# Patient Record
Sex: Female | Born: 1985 | Race: White | Hispanic: No | Marital: Married | Smoking: Current every day smoker
Health system: Southern US, Community
[De-identification: ages and names within clinical notes are randomized; demographics above are authoritative.]

## PROBLEM LIST (undated history)

## (undated) ENCOUNTER — Inpatient Hospital Stay: Admission: EM | Payer: Self-pay | Source: Home / Self Care

## (undated) DIAGNOSIS — R102 Pelvic and perineal pain: Secondary | ICD-10-CM

## (undated) DIAGNOSIS — F419 Anxiety disorder, unspecified: Secondary | ICD-10-CM

## (undated) DIAGNOSIS — L853 Xerosis cutis: Secondary | ICD-10-CM

## (undated) DIAGNOSIS — Z87442 Personal history of urinary calculi: Secondary | ICD-10-CM

## (undated) DIAGNOSIS — N920 Excessive and frequent menstruation with regular cycle: Secondary | ICD-10-CM

## (undated) DIAGNOSIS — J410 Simple chronic bronchitis: Secondary | ICD-10-CM

## (undated) DIAGNOSIS — N809 Endometriosis, unspecified: Secondary | ICD-10-CM

## (undated) HISTORY — PX: WISDOM TOOTH EXTRACTION: SHX21

## (undated) HISTORY — PX: EYE SURGERY: SHX253

## (undated) HISTORY — PX: EXTRACORPOREAL SHOCK WAVE LITHOTRIPSY: SHX1557

---
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
## 2006-08-21 ENCOUNTER — Inpatient Hospital Stay (HOSPITAL_COMMUNITY): Admission: EM | Admit: 2006-08-21 | Discharge: 2006-08-23 | Payer: Self-pay | Admitting: Psychiatry

## 2006-08-21 ENCOUNTER — Ambulatory Visit: Payer: Self-pay | Admitting: Psychiatry

## 2007-06-10 ENCOUNTER — Emergency Department (HOSPITAL_COMMUNITY): Admission: EM | Admit: 2007-06-10 | Discharge: 2007-06-10 | Payer: Self-pay | Admitting: Emergency Medicine

## 2009-09-28 ENCOUNTER — Emergency Department (HOSPITAL_COMMUNITY): Admission: EM | Admit: 2009-09-28 | Discharge: 2009-09-28 | Payer: Self-pay | Admitting: Emergency Medicine

## 2010-03-05 ENCOUNTER — Emergency Department: Payer: Self-pay | Admitting: Emergency Medicine

## 2010-04-28 ENCOUNTER — Emergency Department: Payer: Self-pay | Admitting: Unknown Physician Specialty

## 2010-12-23 NOTE — Discharge Summary (Signed)
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  She overdosed on Prozac, and it was her  first overdose ever.  She described it only as an effort to get her  husband's attention.  She reported feeling neglected by him.  She  reported being sad and increasingly depressed since the birth of her  first child 2 years ago.  She was not involved in any psychiatric  treatment on outpatient basis.  Please refer to the admission note for  further details pertaining to the symptoms, circumstances and history  that led to her hospitalization.  She was also felt to have a problem of  taking excessive prescribed Xanax.  She was given an initial Axis I  diagnosis of depressive disorder NOS, benzodiazepine abuse, and marital  problem.   MEDICAL AND LABORATORY:  The patient was medically and physically  assessed by the psychiatric nurse practitioner.  She was in good health  without any active or chronic medical problems.  There were no  significant medical issues.   HOSPITAL COURSE:  The patient was admitted to the adult inpatient  psychiatric service.  She presented as a well-nourished, well-developed  young woman who was very pleasant, alert, and fully oriented.  She was  sad and appeared somewhat depressed.  She was quite open, and admitting  her sadness around her situation at home, but was fairly convincing in  stating that she had not been trying to  kill herself.  She did admit  that she was trying to get high.  She presented without any symptoms  of psychosis or thought disorder.  She verbalized a strong desire for  help.   The patient had been taking Prozac, prescribed by the family doctor, for  quite some time, but she did not feel it was helping her.  The patient,  during this hospital stay, concluded that she did not like the idea of  taking any medication, and wanted to get away from all psychotropic  medication.  She described a sense of being psychologically dependent on  medication and wanted to do without that.  As such, although there was  discussion about the possibility of a new antidepressant trial, the  patient declined this.   The patient was ordered Librium on a p.r.n. basis, but it did not really  appear to be necessary.  She did not have any significant Xanax  withdrawal symptoms during this inpatient stay.   Early on in her stay, there was a family session involving the patient  and  her husband.  In that meeting she reiterated that she was not  suicidal.  She and her husband discussed the stressors that they had  been dealing with, and the fact that the patient tends not to express  her feelings with him openly.  Husband stated that he wanted to support  the patient and stated that he did not know that the patient was  addicted to pills such as Xanax.  The husband stated he realized that he  needed to be more understanding of the patient's feelings.  They  discussed the possibility of the patient putting some distance between  herself and her family for awhile until she is feeling better.  Apparently this was some source of stress for the patient.  The husband  stated that he was going to listen more to the patient.  The patient  stated that she wanted her husband to support her and not blame her for  feeling badly.  The patient stated that she would never hurt herself.  She talked of wanting to go to  counseling after discharge.  The husband  stated that he would remove all pills from their home.  There were given  a pamphlet on suicide prevention and the crisis hotline.   By the third hospital day, the patient was in much better spirits, and  reported feeling good about the family session she had with her husband.  She agreed to the following aftercare plan.   AFTERCARE:  The patient was to [facility] on [facility].   DISCHARGE MEDICATIONS:  None, as the patient refused new medication  trials.   DISCHARGE DIAGNOSES:  AXIS I: Depressive disorder NOS.  History of  benzodiazepine abuse, early remission.  AXIS II: Deferred.  AXIS III: No acute or chronic illnesses.  AXIS IV: Stressors severe.  AXIS V: GAF on discharge 70.      Anselm Jungling, MD  Electronically Signed     SPB/MEDQ  D:  08/24/2006  T:  08/24/2006  Job:  [id number]

## 2010-12-24 ENCOUNTER — Emergency Department: Payer: Self-pay | Admitting: Unknown Physician Specialty

## 2011-05-16 LAB — I-STAT 8, (EC8 V) (CONVERTED LAB)
Acid-base deficit: 2
Chloride: 108
HCT: 41
Hemoglobin: 13.9
Operator id: 198171
Potassium: 4.1
Sodium: 139
pCO2, Ven: 34.3 — ABNORMAL LOW

## 2011-05-16 LAB — POCT PREGNANCY, URINE: Operator id: 198171

## 2011-05-16 LAB — D-DIMER, QUANTITATIVE: D-Dimer, Quant: <0.22

## 2011-05-16 LAB — RAPID URINE DRUG SCREEN, HOSP PERFORMED
Barbiturates: NOT DETECTED
Opiates: NOT DETECTED

## 2011-05-16 LAB — POCT I-STAT CREATININE: Operator id: 198171

## 2011-09-24 ENCOUNTER — Emergency Department: Payer: Self-pay | Admitting: Emergency Medicine

## 2012-01-17 ENCOUNTER — Emergency Department: Payer: Self-pay | Admitting: Emergency Medicine

## 2012-01-17 LAB — PREGNANCY, URINE: Pregnancy Test, Urine: NEGATIVE m[IU]/mL

## 2012-01-17 LAB — URINALYSIS, COMPLETE
Blood: NEGATIVE
Glucose,UR: NEGATIVE mg/dL (ref 0–75)
Nitrite: NEGATIVE
Ph: 7 (ref 4.5–8.0)
Protein: NEGATIVE
RBC,UR: 1 /HPF (ref 0–5)
Squamous Epithelial: 4
WBC UR: 3 /HPF (ref 0–5)

## 2012-01-17 LAB — COMPREHENSIVE METABOLIC PANEL
Alkaline Phosphatase: 76 U/L (ref 50–136)
Bilirubin,Total: 0.2 mg/dL (ref 0.2–1.0)
Chloride: 109 mmol/L — ABNORMAL HIGH (ref 98–107)
Co2: 28 mmol/L (ref 21–32)
Creatinine: 0.68 mg/dL (ref 0.60–1.30)
EGFR (African American): >60
EGFR (Non-African Amer.): >60
Osmolality: 280 (ref 275–301)
Potassium: 4.6 mmol/L (ref 3.5–5.1)
SGOT(AST): 15 U/L (ref 15–37)
Sodium: 140 mmol/L (ref 136–145)
Total Protein: 7.8 g/dL (ref 6.4–8.2)

## 2012-01-17 LAB — CBC
HCT: 44.8 % (ref 35.0–47.0)
HGB: 15.1 g/dL (ref 12.0–16.0)
MCH: 31.5 pg (ref 26.0–34.0)
WBC: 7 10*3/uL (ref 3.6–11.0)

## 2012-04-19 ENCOUNTER — Ambulatory Visit: Payer: Self-pay | Admitting: Nurse Practitioner

## 2012-05-03 ENCOUNTER — Encounter (HOSPITAL_COMMUNITY): Payer: Self-pay

## 2012-05-14 ENCOUNTER — Encounter (HOSPITAL_COMMUNITY): Payer: Self-pay

## 2012-05-14 ENCOUNTER — Encounter (HOSPITAL_COMMUNITY)
Admission: RE | Admit: 2012-05-14 | Discharge: 2012-05-14 | Disposition: A | Discharge status: Home / Self Care | Payer: Commercial Managed Care - PPO | Source: Ambulatory Visit | Attending: Obstetrics and Gynecology | Admitting: Obstetrics and Gynecology

## 2012-05-14 LAB — CBC
HCT: 41.5 % (ref 36.0–46.0)
MCH: 31 pg (ref 26.0–34.0)
MCV: 90.6 fL (ref 78.0–100.0)
RDW: 13.3 % (ref 11.5–15.5)
WBC: 7.2 10*3/uL (ref 4.0–10.5)

## 2012-05-14 NOTE — Patient Instructions (Addendum)
   Your procedure is scheduled on:  Wednesday, Oct 9th  Enter through the [employer] [facility] at: 7 am Pick up the phone at the desk and dial [phone] and inform us of your arrival.  Please call this number if you have any problems the morning of surgery: [phone]  Remember: Do not eat food after midnight: Tuesday Do not drink clear liquids after midnight: Tuesday: Take these medicines the morning of surgery with a SIP OF WATER: None  Do not wear jewelry, make-up, or FINGER nail polish No metal in your hair or on your body. Do not wear lotions, powders, perfumes. You may wear deodorant.  Please use your CHG wash as directed prior to surgery.  Do not shave anywhere for at least 12 hours prior to first CHG shower.  Do not bring valuables to the hospital. Contacts, dentures or bridgework may not be worn into surgery.  Patients discharged on the day of surgery will not be allowed to drive home.  Home with fiance Leidy Bodin cell [phone]

## 2012-05-14 NOTE — H&P (Signed)
[name] is an 26 y.o. female. Presenting for diagnostic laparoscopy with laser standby and hysteroscopy.   Patient with a history of extremely heavy cycles with severe dysmenorrhea.  Also pain with intercourse during deep penetration.  Ultrasound of pelvis was normal.  Maybe dealing with endometriosis.  Alternatives discussed including birth control pills, mirena IUD, depo provera vs surgery.  Pertinent Gynecological History: Menses: flow is excessive with use of 7 pads or tampons on heaviest days Bleeding: regular but heavy Contraception: vasectomy DES exposure: denies Blood transfusions: none Sexually transmitted diseases: no past history Previous GYN Procedures: none  Last mammogram: na Date: na Last pap: abnormal: low grade dysplasia Date: 04/15/12 OB History: G2, P1   Menstrual History: Menarche age: 44 lmp 04/07/12      Pmh: usual childhood illnesses without complications  Surgery:  Cholecystectomy 2006.  Hernia repair 2006.  Left eye surgery at age 89  One vaginal delivery  familly history noncontributory  Social History: one pack per day tobacco use.  No alcohol use  Allergies:  Allergies  Allergen Reactions  . Ivp Dye (Iodinated Diagnostic Agents) Hives and Itching    No prescriptions prior to admission    Review of Systems  Constitutional: Negative.   Eyes: Negative.   Respiratory: Negative.   Cardiovascular: Negative.   Gastrointestinal: Positive for abdominal pain.  Genitourinary:       Heavy cycles with severe dysmenorrhea and dysparunia  Musculoskeletal: Negative.   Skin: Negative.   Neurological: Positive for dizziness and headaches.  Endo/Heme/Allergies: Negative.   Psychiatric/Behavioral: Negative.     Height 5\' 3"  (1.6 m), weight 65.772 kg (145 lb). Physical Exam  Constitutional: She is oriented to person, place, and time. She appears well-developed and well-nourished.  HENT:  Head: Normocephalic and atraumatic.  Mouth/Throat: Oropharynx  is clear and moist.  Eyes: Conjunctivae normal and EOM are normal. Pupils are equal, round, and reactive to light.  Neck: Normal range of motion. Neck supple.  Cardiovascular: Normal rate, regular rhythm and normal heart sounds.   Respiratory: Effort normal and breath sounds normal.  GI: Soft. She exhibits no distension and no mass. There is tenderness. There is no rebound and no guarding.  Genitourinary: Vagina normal and uterus normal.  Musculoskeletal: Normal range of motion. She exhibits no edema.  Neurological: She is alert and oriented to person, place, and time. She has normal reflexes.     No results found for this or any previous visit (from the past 24 hour(s)).  No results found.  Assessment/Plan: Menorrhagia with dyspareunia and dysmenorrhea.  R/o endometriosis or uterine abnormalities  precede with diagnostic laparoscopy with laser standby and hysteroscopy.  Risk discussed.  Infection.   Hemorrhage that could require transfusions with risk of aids or hepatitis.   Injury to adjacent organs such as bowel, bladder of blood vessels that could require exploratory surgery.  DVT and PE's.  Patient express an understanding of the indications and risks and alternatives  [name] S 05/14/2012, 10:14 AM

## 2012-05-15 ENCOUNTER — Ambulatory Visit (HOSPITAL_COMMUNITY): Payer: Commercial Managed Care - PPO | Admitting: Anesthesiology

## 2012-05-15 ENCOUNTER — Ambulatory Visit (HOSPITAL_COMMUNITY)
Admission: RE | Admit: 2012-05-15 | Discharge: 2012-05-15 | Disposition: A | Discharge status: Home / Self Care | Payer: Commercial Managed Care - PPO | Source: Ambulatory Visit | Attending: Obstetrics and Gynecology | Admitting: Obstetrics and Gynecology

## 2012-05-15 ENCOUNTER — Encounter (HOSPITAL_COMMUNITY)
Admission: RE | Disposition: A | Discharge status: Home / Self Care | Payer: Self-pay | Source: Ambulatory Visit | Attending: Obstetrics and Gynecology

## 2012-05-15 ENCOUNTER — Encounter (HOSPITAL_COMMUNITY): Payer: Self-pay | Admitting: Anesthesiology

## 2012-05-15 DIAGNOSIS — N946 Dysmenorrhea, unspecified: Secondary | ICD-10-CM | POA: Insufficient documentation

## 2012-05-15 DIAGNOSIS — IMO0002 Reserved for concepts with insufficient information to code with codable children: Secondary | ICD-10-CM | POA: Insufficient documentation

## 2012-05-15 DIAGNOSIS — N920 Excessive and frequent menstruation with regular cycle: Secondary | ICD-10-CM | POA: Insufficient documentation

## 2012-05-15 DIAGNOSIS — N809 Endometriosis, unspecified: Secondary | ICD-10-CM

## 2012-05-15 DIAGNOSIS — Z01818 Encounter for other preprocedural examination: Secondary | ICD-10-CM | POA: Insufficient documentation

## 2012-05-15 DIAGNOSIS — Z01812 Encounter for preprocedural laboratory examination: Secondary | ICD-10-CM | POA: Insufficient documentation

## 2012-05-15 DIAGNOSIS — N803 Endometriosis of pelvic peritoneum, unspecified: Secondary | ICD-10-CM | POA: Insufficient documentation

## 2012-05-15 HISTORY — PX: LAPAROSCOPY: SHX197

## 2012-05-15 SURGERY — LAPAROSCOPY OPERATIVE
Anesthesia: General | Site: Uterus | Wound class: Clean Contaminated

## 2012-05-15 MED ORDER — SCOPOLAMINE 1 MG/3DAYS TD PT72
1.0000 | MEDICATED_PATCH | TRANSDERMAL | Status: DC
  Administered 2012-05-15: 1.5 mg via TRANSDERMAL

## 2012-05-15 MED ORDER — NEOSTIGMINE METHYLSULFATE 1 MG/ML IJ SOLN
INTRAMUSCULAR | Status: DC | PRN
  Administered 2012-05-15: 3 mg via INTRAVENOUS

## 2012-05-15 MED ORDER — KETOROLAC TROMETHAMINE 30 MG/ML IJ SOLN
INTRAMUSCULAR | Status: AC
  Filled 2012-05-15: qty 1

## 2012-05-15 MED ORDER — DEXAMETHASONE SODIUM PHOSPHATE 4 MG/ML IJ SOLN
INTRAMUSCULAR | Status: DC | PRN
  Administered 2012-05-15: 5 mg via INTRAVENOUS

## 2012-05-15 MED ORDER — GLYCOPYRROLATE 0.2 MG/ML IJ SOLN
INTRAMUSCULAR | Status: AC
  Filled 2012-05-15: qty 1

## 2012-05-15 MED ORDER — LIDOCAINE-EPINEPHRINE (PF) 1 %-1:200000 IJ SOLN
INTRAMUSCULAR | Status: AC
  Filled 2012-05-15: qty 10

## 2012-05-15 MED ORDER — FENTANYL CITRATE 0.05 MG/ML IJ SOLN
INTRAMUSCULAR | Status: DC | PRN
  Administered 2012-05-15: 100 ug via INTRAVENOUS
  Administered 2012-05-15 (×3): 50 ug via INTRAVENOUS

## 2012-05-15 MED ORDER — MIDAZOLAM HCL 2 MG/2ML IJ SOLN
INTRAMUSCULAR | Status: AC
  Filled 2012-05-15: qty 2

## 2012-05-15 MED ORDER — LACTATED RINGERS IV SOLN
INTRAVENOUS | Status: DC
  Administered 2012-05-15 (×2): via INTRAVENOUS

## 2012-05-15 MED ORDER — GLYCOPYRROLATE 0.2 MG/ML IJ SOLN
INTRAMUSCULAR | Status: DC | PRN
  Administered 2012-05-15: 0.4 mg via INTRAVENOUS

## 2012-05-15 MED ORDER — FENTANYL CITRATE 0.05 MG/ML IJ SOLN
INTRAMUSCULAR | Status: AC
  Filled 2012-05-15: qty 5

## 2012-05-15 MED ORDER — ONDANSETRON HCL 4 MG/2ML IJ SOLN
INTRAMUSCULAR | Status: AC
  Filled 2012-05-15: qty 2

## 2012-05-15 MED ORDER — FENTANYL CITRATE 0.05 MG/ML IJ SOLN
INTRAMUSCULAR | Status: AC
  Filled 2012-05-15: qty 2

## 2012-05-15 MED ORDER — FENTANYL CITRATE 0.05 MG/ML IJ SOLN
25.0000 ug | INTRAMUSCULAR | Status: DC | PRN
  Administered 2012-05-15 (×3): 50 ug via INTRAVENOUS

## 2012-05-15 MED ORDER — BUPIVACAINE HCL (PF) 0.25 % IJ SOLN
INTRAMUSCULAR | Status: AC
  Filled 2012-05-15: qty 30

## 2012-05-15 MED ORDER — LIDOCAINE HCL (CARDIAC) 20 MG/ML IV SOLN
INTRAVENOUS | Status: DC | PRN
  Administered 2012-05-15: 100 mg via INTRAVENOUS

## 2012-05-15 MED ORDER — LACTATED RINGERS IR SOLN
Status: DC | PRN
  Administered 2012-05-15: 3000 mL

## 2012-05-15 MED ORDER — CEFAZOLIN SODIUM-DEXTROSE 2-3 GM-% IV SOLR
2.0000 g | INTRAVENOUS | Status: AC
  Administered 2012-05-15: 2 g via INTRAVENOUS

## 2012-05-15 MED ORDER — PROPOFOL 10 MG/ML IV EMUL
INTRAVENOUS | Status: DC | PRN
  Administered 2012-05-15: 200 mg via INTRAVENOUS

## 2012-05-15 MED ORDER — CEFAZOLIN SODIUM-DEXTROSE 2-3 GM-% IV SOLR
INTRAVENOUS | Status: AC
  Filled 2012-05-15: qty 50

## 2012-05-15 MED ORDER — MIDAZOLAM HCL 5 MG/5ML IJ SOLN
INTRAMUSCULAR | Status: DC | PRN
  Administered 2012-05-15: 2 mg via INTRAVENOUS

## 2012-05-15 MED ORDER — BUPIVACAINE HCL (PF) 0.25 % IJ SOLN
INTRAMUSCULAR | Status: DC | PRN
  Administered 2012-05-15: 6 mL

## 2012-05-15 MED ORDER — LIDOCAINE-EPINEPHRINE (PF) 1 %-1:200000 IJ SOLN
INTRAMUSCULAR | Status: DC | PRN
  Administered 2012-05-15: 10 mL

## 2012-05-15 MED ORDER — SCOPOLAMINE 1 MG/3DAYS TD PT72
MEDICATED_PATCH | TRANSDERMAL | Status: AC
  Administered 2012-05-15: 1.5 mg via TRANSDERMAL
  Filled 2012-05-15: qty 1

## 2012-05-15 MED ORDER — GLYCINE 1.5 % IR SOLN
Status: DC | PRN
  Administered 2012-05-15: 3000 mL

## 2012-05-15 MED ORDER — ROCURONIUM BROMIDE 50 MG/5ML IV SOLN
INTRAVENOUS | Status: AC
  Filled 2012-05-15: qty 1

## 2012-05-15 MED ORDER — KETOROLAC TROMETHAMINE 30 MG/ML IJ SOLN: 15.0000 mg | Freq: Once | INTRAMUSCULAR | Status: DC | PRN

## 2012-05-15 MED ORDER — NEOSTIGMINE METHYLSULFATE 1 MG/ML IJ SOLN
INTRAMUSCULAR | Status: AC
  Filled 2012-05-15: qty 10

## 2012-05-15 MED ORDER — LIDOCAINE HCL (CARDIAC) 20 MG/ML IV SOLN
INTRAVENOUS | Status: AC
  Filled 2012-05-15: qty 5

## 2012-05-15 MED ORDER — ROCURONIUM BROMIDE 100 MG/10ML IV SOLN
INTRAVENOUS | Status: DC | PRN
  Administered 2012-05-15: 35 mg via INTRAVENOUS

## 2012-05-15 MED ORDER — KETOROLAC TROMETHAMINE 30 MG/ML IJ SOLN
INTRAMUSCULAR | Status: DC | PRN
  Administered 2012-05-15: 30 mg via INTRAVENOUS

## 2012-05-15 MED ORDER — PROPOFOL 10 MG/ML IV EMUL
INTRAVENOUS | Status: AC
  Filled 2012-05-15: qty 20

## 2012-05-15 MED ORDER — ONDANSETRON HCL 4 MG/2ML IJ SOLN
INTRAMUSCULAR | Status: DC | PRN
  Administered 2012-05-15: 4 mg via INTRAVENOUS

## 2012-05-15 SURGICAL SUPPLY — 42 items
CABLE HIGH FREQUENCY MONO STRZ (ELECTRODE) IMPLANT
CANISTER SUCTION 2500CC (MISCELLANEOUS) ×6 IMPLANT
CATH ROBINSON RED A/P 16FR (CATHETERS) ×3 IMPLANT
CLOTH BEACON ORANGE TIMEOUT ST (SAFETY) ×3 IMPLANT
CONTAINER PREFILL 10% NBF 60ML (FORM) ×3 IMPLANT
DECANTER SPIKE VIAL GLASS SM (MISCELLANEOUS) ×3 IMPLANT
DERMABOND ADVANCED (GAUZE/BANDAGES/DRESSINGS) ×1
DERMABOND ADVANCED .7 DNX12 (GAUZE/BANDAGES/DRESSINGS) ×2 IMPLANT
DRESSING TELFA 8X3 (GAUZE/BANDAGES/DRESSINGS) ×3 IMPLANT
DRSG COVADERM PLUS 2X2 (GAUZE/BANDAGES/DRESSINGS) ×3 IMPLANT
ELECT REM PT RETURN 9FT ADLT (ELECTROSURGICAL) ×3
ELECTRODE REM PT RTRN 9FT ADLT (ELECTROSURGICAL) ×2 IMPLANT
GLOVE BIO SURGEON STRL SZ7 (GLOVE) ×9 IMPLANT
GLOVE BIOGEL PI IND STRL 7.0 (GLOVE) ×8 IMPLANT
GLOVE BIOGEL PI INDICATOR 7.0 (GLOVE) ×4
GOWN PREVENTION PLUS LG XLONG (DISPOSABLE) ×6 IMPLANT
GOWN PREVENTION PLUS XLARGE (GOWN DISPOSABLE) ×6 IMPLANT
GOWN STRL REIN 2XL XLG LVL4 (GOWN DISPOSABLE) ×3 IMPLANT
GOWN STRL REIN XL XLG (GOWN DISPOSABLE) ×6 IMPLANT
LOOP ANGLED CUTTING 22FR (CUTTING LOOP) IMPLANT
NEEDLE INSUFFLATION 14GA 120MM (NEEDLE) IMPLANT
NEEDLE SPNL 20GX3.5 QUINCKE YW (NEEDLE) ×3 IMPLANT
NS IRRIG 1000ML POUR BTL (IV SOLUTION) ×3 IMPLANT
PACK HYSTEROSCOPY LF (CUSTOM PROCEDURE TRAY) ×3 IMPLANT
PACK LAPAROSCOPY BASIN (CUSTOM PROCEDURE TRAY) ×3 IMPLANT
PAD OB MATERNITY 4.3X12.25 (PERSONAL CARE ITEMS) ×3 IMPLANT
POUCH SPECIMEN RETRIEVAL 10MM (ENDOMECHANICALS) IMPLANT
PROTECTOR NERVE ULNAR (MISCELLANEOUS) ×3 IMPLANT
SCISSORS LAP 5X35 DISP (ENDOMECHANICALS) IMPLANT
SCISSORS LAP 5X45 EPIX DISP (ENDOMECHANICALS) IMPLANT
SEALER TISSUE G2 CVD JAW 45CM (ENDOMECHANICALS) IMPLANT
SET IRRIG TUBING LAPAROSCOPIC (IRRIGATION / IRRIGATOR) ×3 IMPLANT
SUT VIC AB 3-0 PS2 18 (SUTURE) ×1
SUT VIC AB 3-0 PS2 18XBRD (SUTURE) ×2 IMPLANT
SUT VICRYL 0 ENDOLOOP (SUTURE) IMPLANT
SUT VICRYL 0 UR6 27IN ABS (SUTURE) ×3 IMPLANT
TOWEL OR 17X24 6PK STRL BLUE (TOWEL DISPOSABLE) ×6 IMPLANT
TROCAR BALLN 12MMX100 BLUNT (TROCAR) ×3 IMPLANT
TROCAR Z-THREAD BLADED 11X100M (TROCAR) IMPLANT
TROCAR Z-THREAD BLADED 5X100MM (TROCAR) ×3 IMPLANT
WARMER LAPAROSCOPE (MISCELLANEOUS) ×3 IMPLANT
WATER STERILE IRR 1000ML POUR (IV SOLUTION) ×3 IMPLANT

## 2012-05-15 NOTE — Op Note (Signed)
[name], [name] NO.:  [id number]  MEDICAL RECORD NO.:  [id number]  LOCATION:  [facility]                          FACILITY:  [facility]  PHYSICIAN:  Juluis Mire, M.D.   DATE OF BIRTH:  Oct 28, 1985  DATE OF PROCEDURE:  05/15/2012 DATE OF DISCHARGE:                              OPERATIVE REPORT   PREOPERATIVE DIAGNOSIS:  Menorrhagia with dysmenorrhea and dyspareunia.  POSTOPERATIVE DIAGNOSIS:  Pelvic endometriosis.  OPERATIVE PROCEDURE:  Paracervical block.  Cervical dilatation with hysteroscopy and endometrial curettings.  Open laparoscopy with cautery of endometriotic implant.  SURGEON:  Juluis Mire, MD  ANESTHESIA:  Paracervical block and general.  ESTIMATED BLOOD LOSS:  Minimal.  PACKS AND DRAINS:  None.  INTRAOPERATIVE BLOOD PLACED:  None.  COMPLICATIONS:  None.  INDICATIONS:  Are as dictated in the history and physical.  PROCEDURE:  The patient was taken to the OR and placed in supine position.  After satisfactory level of general endotracheal anesthesia was obtained, the patient was placed in the dorsal lithotomy position. The abdomen, perineum, and vagina were prepped out with Betadine and draped as a sterile field.  A speculum was placed in vaginal vault.  The cervix was grasped with single-tooth tenaculum.  A paracervical block with 1% Xylocaine with epinephrine was instituted.  We used approximately 10 mL.  Uterus sounded to approximately 9 cm.  Cervix was serially dilated to a size 23 Pratt dilator.  Hysteroscope was introduced.  Intrauterine cavity was distended using glycine.  She had a completely normal endometrium, visualized and noted to be normal.  The hysteroscope was then removed.  Total deficit of glycine was 35 mL. Endometrial curettings were obtained and sent for Pathology.  The Hulka tenaculum was then put in place.  Bladder was emptied by in and out catheterization.  Subumbilical incision was made with a knife.  The incision  was extended through the subcutaneous tissue.  Fascia was identified and entered sharply.  Incision was fashioned laterally.  We were able to enter the peritoneum with blunt finger pressure.  The open laparoscopic trocar was put in place and secured.  The abdomen was desufflated with carbon dioxide.  Laparoscope was introduced.  There was no evidence of injury to adjacent organs.  A 5-mm trocar was put in place in the suprapubic area under direct visualization.  Visualization of the appendix was normal.  The gallbladder was surgically absent. Both lateral gutters were clear.  Uterus was of normal size and shape. Both tubes were normal.  The left ovary had a hemorrhagic corpus luteum. Right ovary is unremarkable.  She did have areas of endometriosis along the right and left pelvic sidewall and cul-de-sac area.  No adhesions were noted.  Using the bipolar, the areas of endometriosis were cauterized.  On the sidewalls, we were careful to avoid the course of the ureter.  At the completion, all areas of endometriosis were completely cauterized.  She would be minimal endometriosis by criteria. We thoroughly irrigated the pelvis, removed irrigation.  The abdomen was deflated with carbon dioxide.  All trocars removed.  Subumbilical fascia closed with figure-of-eight of 0 Vicryl.  Skin was closed with interrupted subcuticulars of 4-0  Vicryl.  Suprapubic incision was closed with Dermabond.  The Hulka tenaculum was then removed.  The patient was taken out of the dorsal lithotomy position.  Once alert and extubated, transferred to recovery room in good condition.     Juluis Mire, M.D.     JSM/MEDQ  D:  05/15/2012  T:  05/15/2012  Job:  [id number]

## 2012-05-15 NOTE — Brief Op Note (Signed)
05/15/2012  9:19 AM  PATIENT:  [name]  26 y.o. female  PRE-OPERATIVE DIAGNOSIS:  menorrhagia, pelvic pain  POST-OPERATIVE DIAGNOSIS:  menorrhagia, pelvic pain  PROCEDURE:  Procedure(s) (LRB) with comments: LAPAROSCOPY OPERATIVE (N/A) DILATATION & CURETTAGE/HYSTEROSCOPY WITH RESECTOCOPE (N/A) - no resectoscope ABLATION ON ENDOMETRIOSIS (N/A)  SURGEON:  Surgeon(s) and Role:    * Juluis Mire, MD - Primary  PHYSICIAN ASSISTANT:   ASSISTANTS: none   ANESTHESIA:   local, general and paracervical block  EBL:  Total I/O In: 1200 [I.V.:1200] Out: 110 [Urine:100; Blood:10]  BLOOD ADMINISTERED:none  DRAINS: none   LOCAL MEDICATIONS USED:  XYLOCAINE with epi , Amount: 10 ml and OTHER nesicaine 6 cc  SPECIMEN:  Source of Specimen:  endometrial currettings  DISPOSITION OF SPECIMEN:  PATHOLOGY  COUNTS:  YES  TOURNIQUET:  * No tourniquets in log *  DICTATION: .Other Dictation: Dictation Number [id number]  PLAN OF CARE: Discharge to home after PACU  PATIENT DISPOSITION:  PACU - hemodynamically stable.   Delay start of Pharmacological VTE agent (>24hrs) due to surgical blood loss or risk of bleeding: not applicable

## 2012-05-15 NOTE — Anesthesia Postprocedure Evaluation (Signed)
Anesthesia Post Note  Patient: [name]  Procedure(s) Performed: Procedure(s) (LRB): LAPAROSCOPY OPERATIVE (N/A) DILATATION & CURETTAGE/HYSTEROSCOPY WITH RESECTOCOPE (N/A) ABLATION ON ENDOMETRIOSIS (N/A)  Anesthesia type: General  Patient location: PACU  Post pain: Pain level controlled  Post assessment: Post-op Vital signs reviewed  Last Vitals:  Filed Vitals:   05/15/12 1000  BP: 99/61  Pulse:   Temp: 37 C  Resp: 15    Post vital signs: Reviewed  Level of consciousness: sedated  Complications: No apparent anesthesia complicationsfj

## 2012-05-15 NOTE — Transfer of Care (Signed)
Immediate Anesthesia Transfer of Care Note  Patient: [name]  Procedure(s) Performed: Procedure(s) (LRB) with comments: LAPAROSCOPY OPERATIVE (N/A) DILATATION & CURETTAGE/HYSTEROSCOPY WITH RESECTOCOPE (N/A) - no resectoscope ABLATION ON ENDOMETRIOSIS (N/A)  Patient Location: PACU  Anesthesia Type: General  Level of Consciousness: awake, alert  and oriented  Airway & Oxygen Therapy: Patient Spontanous Breathing and Patient connected to nasal cannula oxygen  Post-op Assessment: Report given to PACU RN and Post -op Vital signs reviewed and stable  Post vital signs: stable  Complications: No apparent anesthesia complications

## 2012-05-15 NOTE — Op Note (Signed)
Patient name  [name], Gilani [id number] CSN# [id number]  [facility], MD 05/15/2012 9:21 AM

## 2012-05-15 NOTE — H&P (Signed)
  History and physical exam unchanged 

## 2012-05-15 NOTE — Anesthesia Preprocedure Evaluation (Addendum)
Anesthesia Evaluation  Patient identified by MRN, date of birth, ID band Patient awake    Reviewed: Allergy & Precautions, H&P , NPO status , Patient's Chart, lab work & pertinent test results, reviewed documented beta blocker date and time   History of Anesthesia Complications (+) PONV  Airway Mallampati: I TM Distance: >3 FB Neck ROM: full    Dental  (+) Teeth Intact   Pulmonary Current Smoker (1 ppd),  breath sounds clear to auscultation  Pulmonary exam normal       Cardiovascular Exercise Tolerance: Good Rhythm:regular Rate:Normal  H/o palpitations, was on medicine briefly, but has been off for over 4 years with no palpitations   Neuro/Psych negative neurological ROS  negative psych ROS   GI/Hepatic negative GI ROS, Neg liver ROS,   Endo/Other  negative endocrine ROS  Renal/GU Kidney stone  Female GU complaint     Musculoskeletal   Abdominal   Peds  Hematology negative hematology ROS (+)   Anesthesia Other Findings   Reproductive/Obstetrics negative OB ROS                          Anesthesia Physical Anesthesia Plan  ASA: II  Anesthesia Plan: General ETT   Post-op Pain Management:    Induction:   Airway Management Planned:   Additional Equipment:   Intra-op Plan:   Post-operative Plan:   Informed Consent: I have reviewed the patients History and Physical, chart, labs and discussed the procedure including the risks, benefits and alternatives for the proposed anesthesia with the patient or authorized representative who has indicated his/her understanding and acceptance.   Dental Advisory Given  Plan Discussed with: CRNA and Surgeon  Anesthesia Plan Comments:        Anesthesia Quick Evaluation

## 2012-05-16 ENCOUNTER — Encounter (HOSPITAL_COMMUNITY): Payer: Self-pay | Admitting: Obstetrics and Gynecology

## 2016-08-28 ENCOUNTER — Emergency Department: Payer: BLUE CROSS/BLUE SHIELD

## 2016-08-28 ENCOUNTER — Encounter: Payer: Self-pay | Admitting: Emergency Medicine

## 2016-08-28 ENCOUNTER — Emergency Department
Admission: EM | Admit: 2016-08-28 | Discharge: 2016-08-28 | Disposition: A | Discharge status: Home / Self Care | Payer: BLUE CROSS/BLUE SHIELD | Attending: Emergency Medicine | Admitting: Emergency Medicine

## 2016-08-28 DIAGNOSIS — N83291 Other ovarian cyst, right side: Secondary | ICD-10-CM

## 2016-08-28 DIAGNOSIS — F17200 Nicotine dependence, unspecified, uncomplicated: Secondary | ICD-10-CM | POA: Diagnosis not present

## 2016-08-28 DIAGNOSIS — R102 Pelvic and perineal pain: Secondary | ICD-10-CM | POA: Diagnosis present

## 2016-08-28 DIAGNOSIS — N83201 Unspecified ovarian cyst, right side: Secondary | ICD-10-CM | POA: Insufficient documentation

## 2016-08-28 HISTORY — DX: Endometriosis, unspecified: N80.9

## 2016-08-28 LAB — URINALYSIS, COMPLETE (UACMP) WITH MICROSCOPIC
BACTERIA UA: NONE SEEN
Bilirubin Urine: NEGATIVE
Glucose, UA: NEGATIVE mg/dL
HGB URINE DIPSTICK: NEGATIVE
KETONES UR: 5 mg/dL — AB
Leukocytes, UA: NEGATIVE
NITRITE: NEGATIVE
PROTEIN: NEGATIVE mg/dL
RBC / HPF: NONE SEEN RBC/hpf (ref 0–5)
Specific Gravity, Urine: 1.029 (ref 1.005–1.030)
pH: 5 (ref 5.0–8.0)

## 2016-08-28 LAB — CBC
HEMATOCRIT: 43.6 % (ref 35.0–47.0)
HEMOGLOBIN: 15.5 g/dL (ref 12.0–16.0)
MCH: 33.1 pg (ref 26.0–34.0)
MCHC: 35.5 g/dL (ref 32.0–36.0)
MCV: 93.2 fL (ref 80.0–100.0)
Platelets: 144 10*3/uL — ABNORMAL LOW (ref 150–440)
RBC: 4.68 MIL/uL (ref 3.80–5.20)
RDW: 13.6 % (ref 11.5–14.5)
WBC: 6.7 10*3/uL (ref 3.6–11.0)

## 2016-08-28 LAB — COMPREHENSIVE METABOLIC PANEL
ALBUMIN: 4.5 g/dL (ref 3.5–5.0)
ALT: 12 U/L — ABNORMAL LOW (ref 14–54)
ANION GAP: 6 (ref 5–15)
AST: 24 U/L (ref 15–41)
Alkaline Phosphatase: 57 U/L (ref 38–126)
BUN: 15 mg/dL (ref 6–20)
CO2: 26 mmol/L (ref 22–32)
Calcium: 9.2 mg/dL (ref 8.9–10.3)
Chloride: 107 mmol/L (ref 101–111)
Creatinine, Ser: 0.74 mg/dL (ref 0.44–1.00)
GFR calc Af Amer: >60 mL/min (ref >60)
GFR calc non Af Amer: >60 mL/min (ref >60)
GLUCOSE: 93 mg/dL (ref 65–99)
POTASSIUM: 3.8 mmol/L (ref 3.5–5.1)
SODIUM: 139 mmol/L (ref 135–145)
Total Bilirubin: 0.7 mg/dL (ref 0.3–1.2)
Total Protein: 7.8 g/dL (ref 6.5–8.1)

## 2016-08-28 LAB — LIPASE, BLOOD: LIPASE: 31 U/L (ref 11–51)

## 2016-08-28 LAB — POCT PREGNANCY, URINE: PREG TEST UR: NEGATIVE

## 2016-08-28 MED ORDER — TRAMADOL HCL 50 MG PO TABS: 50.0000 mg | ORAL_TABLET | Freq: Four times a day (QID) | ORAL | 0 refills | Status: DC | PRN

## 2016-08-28 MED ORDER — FLUCONAZOLE 150 MG PO TABS: 150.0000 mg | ORAL_TABLET | Freq: Every day | ORAL | 0 refills | Status: DC

## 2016-08-28 MED ORDER — IBUPROFEN 600 MG PO TABS: 600.0000 mg | ORAL_TABLET | Freq: Three times a day (TID) | ORAL | 0 refills | Status: DC | PRN

## 2016-08-28 NOTE — ED Notes (Signed)
See triage note states she developed some vaginal discharge about 1 month ago  States the discharge was thick and white  Was using OTC meds for same  But states she is having increased RLQ pain over the past few days   No fever but has had decreased po intake

## 2016-08-28 NOTE — ED Triage Notes (Signed)
Patient with complaint of thick white vaginal discharge times two months. Patient states that she has used monistat with no improvement. Patient states that she has also lost around 15 pounds in the last 2 months without trying to loose weight. Patient states that she started her period yesterday and she is having abnormal amount of abdominal cramping.

## 2016-08-28 NOTE — ED Provider Notes (Signed)
[facility] Emergency Department Provider Note   ____________________________________________   First MD Initiated Contact with Patient 08/28/16 [phone]     (approximate)  I have reviewed the triage vital signs and the nursing notes.   HISTORY  Chief Complaint Vaginal Discharge and Abdominal Pain    HPI [name] is a 31 y.o. female patient complain of thick whitish vaginal discharge for 2 months. Patient stated no relief using over-the-counter preparations. Patient also complaining of 15 pound weight loss in the past 2 months without dieting. Patient also complaining of right pelvic pain but she is also started her period yesterday. Patient state she has cramping associated with her menstrual cycle but this pain is sharp. Patient has a history of endometriosis. No other palliative measures for her complaint. Patient is rating her pain as a 9/10. Patient described a pain as "sharp".   Past Medical History:  Diagnosis Date  . Endometriosis   . Kidney stone    no surgery required  . Mental disorder    hx post part depression  . SVD (spontaneous vaginal delivery)    x 1    Patient Active Problem List   Diagnosis Date Noted  . Endometriosis 05/15/2012    Class: Present on Admission    Past Surgical History:  Procedure Laterality Date  . CHOLECYSTECTOMY    . EYE SURGERY     left eye surg as a child  . HERNIA REPAIR    . LAPAROSCOPY  05/15/2012   Procedure: LAPAROSCOPY OPERATIVE;  Surgeon: Juluis Mire, MD;  Location: [facility];  Service: Gynecology;  Laterality: N/A;  . WISDOM TOOTH EXTRACTION      Prior to Admission medications   Medication Sig Start Date End Date Taking? Authorizing Provider  fluconazole (DIFLUCAN) 150 MG tablet Take 1 tablet (150 mg total) by mouth daily. 08/28/16   Joni Reining, PA-C  ibuprofen (ADVIL,MOTRIN) 600 MG tablet Take 1 tablet (600 mg total) by mouth every 8 (eight) hours as needed. 08/28/16   Joni Reining,  PA-C  naproxen (NAPROSYN) 500 MG tablet Take 500 mg by mouth 2 (two) times daily as needed. For cramping    Historical Provider, MD  traMADol (ULTRAM) 50 MG tablet Take 1 tablet (50 mg total) by mouth every 6 (six) hours as needed. 08/28/16 08/28/17  Joni Reining, PA-C    Allergies Ivp dye [iodinated diagnostic agents] and Zofran [ondansetron hcl]  No family history on file.  Social History Social History  Substance Use Topics  . Smoking status: Current Every Day Smoker    Packs/day: 1.00    Years: 18.00  . Smokeless tobacco: Never Used  . Alcohol use Yes     Comment: ocassional    Review of Systems Constitutional: No fever/chills Eyes: No visual changes. ENT: No sore throat. Cardiovascular: Denies chest pain. Respiratory: Denies shortness of breath. Gastrointestinal: No abdominal pain.  No nausea, no vomiting.  No diarrhea.  No constipation. Genitourinary: Vaginal discharge and pelvic pain. Musculoskeletal: Negative for back pain. Skin: Negative for rash. Neurological: Negative for headaches, focal weakness or numbness. Allergic/Immunilogical: IVP dye.   ____________________________________________   PHYSICAL EXAM:  VITAL SIGNS: ED Triage Vitals  Enc Vitals Group     BP 08/28/16 0440 (!) 127/97     Pulse Rate 08/28/16 0440 76     Resp 08/28/16 0440 18     Temp 08/28/16 0440 98.1 F (36.7 C)     Temp Source 08/28/16 0440 Oral  SpO2 08/28/16 0440 100 %     Weight 08/28/16 0440 132 lb (59.9 kg)     Height 08/28/16 0440 5\' 8"  (1.727 m)     Head Circumference --      Peak Flow --      Pain Score 08/28/16 0441 9     Pain Loc --      Pain Edu? --      Excl. in GC? --     Constitutional: Alert and oriented. Well appearing and in no acute distress. Eyes: Conjunctivae are normal. PERRL. EOMI. Head: Atraumatic. Nose: No congestion/rhinnorhea. Mouth/Throat: Mucous membranes are moist.  Oropharynx non-erythematous. Neck: No stridor.  No cervical spine  tenderness to palpation. Hematological/Lymphatic/Immunilogical: No cervical lymphadenopathy. Cardiovascular: Normal rate, regular rhythm. Grossly normal heart sounds.  Good peripheral circulation. Respiratory: Normal respiratory effort.  No retractions. Lungs CTAB. Gastrointestinal: Soft and nontender. No distention. No abdominal bruits. No CVA tenderness. Genitourinary: Vaginal exam deferred secondary to onset of menses. Patient is moderate guarding right inguinal area. Musculoskeletal: No lower extremity tenderness nor edema.  No joint effusions. Neurologic:  Normal speech and language. No gross focal neurologic deficits are appreciated. No gait instability. Skin:  Skin is warm, dry and intact. No rash noted. Psychiatric: Mood and affect are normal. Speech and behavior are normal.  ____________________________________________   LABS (all labs ordered are listed, but only abnormal results are displayed)  Labs Reviewed  COMPREHENSIVE METABOLIC PANEL - Abnormal; Notable for the following:       Result Value   ALT 12 (*)    All other components within normal limits  CBC - Abnormal; Notable for the following:    Platelets 144 (*)    All other components within normal limits  URINALYSIS, COMPLETE (UACMP) WITH MICROSCOPIC - Abnormal; Notable for the following:    Color, Urine AMBER (*)    APPearance HAZY (*)    Ketones, ur 5 (*)    Squamous Epithelial / LPF 0-5 (*)    All other components within normal limits  LIPASE, BLOOD  POCT PREGNANCY, URINE   ____________________________________________  EKG   ____________________________________________  RADIOLOGY  FINDINGS CONSISTENT WITH HEMORRHAGIC RIGHT OVARIAN CYST. ____________________________________________   PROCEDURES  Procedure(s) performed: None  Procedures  Critical Care performed: No  ____________________________________________   INITIAL IMPRESSION / ASSESSMENT AND PLAN / ED COURSE  Pertinent labs & imaging  results that were available during my care of the patient were reviewed by me and considered in my medical decision making (see chart for details).  Right ovarian cyst. Patient given discharge care instructions. Patient given a prescription for tramadol and ibuprofen. Patient also given a prescription for Diflucan since the vaginal exam was deferred. Patient advised follow-up family doctor in 6-8 weeks for reevaluation of urine since.      ____________________________________________   FINAL CLINICAL IMPRESSION(S) / ED DIAGNOSES  Final diagnoses:  Pelvic pain  Complex cyst of right ovary      NEW MEDICATIONS STARTED DURING THIS VISIT:  New Prescriptions   FLUCONAZOLE (DIFLUCAN) 150 MG TABLET    Take 1 tablet (150 mg total) by mouth daily.   IBUPROFEN (ADVIL,MOTRIN) 600 MG TABLET    Take 1 tablet (600 mg total) by mouth every 8 (eight) hours as needed.   TRAMADOL (ULTRAM) 50 MG TABLET    Take 1 tablet (50 mg total) by mouth every 6 (six) hours as needed.     Note:  This document was prepared using Conservation officer, historic buildingsDragon voice recognition software and may include  unintentional dictation errors.    Joni Reining, PA-C 08/28/16 0845    Rockne Menghini, MD 08/28/16 1622

## 2016-10-12 NOTE — H&P (Signed)
NAMJorge Mandril:  [name], [name]                ACCOUNT NO.:  [id number]  MEDICAL RECORD NO.:  [id number]  LOCATION:                                 FACILITY:  PHYSICIAN:  Juluis MireJohn S. Erandy Mceachern, M.D.   DATE OF BIRTH:  02-04-1986  DATE OF ADMISSION: DATE OF DISCHARGE:                             HISTORY & PHYSICAL   DATE OF SURGERY:  October 30, 2016, [facility]  HISTORY OF PRESENT ILLNESS:  The patient is a 31 year old, gravida 2, para 1, married female, comes in for laparoscopic-assisted vaginal hysterectomy with removal of both fallopian tubes.  In relation to the present admission, the patient has been having trouble with increasing menorrhagia.  She has extremely heavy cycles that required visits to the emergency room.  She has undergone ultrasound evaluation on January 22nd, after emergency room visit, it was negative.  She has a past history of laparoscopic evaluation with finding of pelvic endometriosis. Presumptively, we are dealing with adenomyosis.  We had discussed with her various options including trying hormonal suppression with either birth control pills or Depo-Provera versus repeat laparoscopy versus hysterectomy.  The patient now presents for the latter.  ALLERGIES:  In terms of allergies, she is allergic to IV dye and Zofran.  MEDICATIONS:  Tramadol.  PAST MEDICAL HISTORY:  She has a history of migraine headaches, irritable bowel syndrome, and repetitive urinary tract infections.  PAST SURGICAL HISTORY:  She has had a hernia repair in 2006, had gallbladder removed in 2007, had a kidney stone in 2012.  She has had a previous known laparoscopy.  She has had 1 delivery.  SOCIAL HISTORY:  She does smoke 2 packs per day.  No alcohol use.  FAMILY HISTORY:  Noncontributory.  REVIEW OF SYSTEMS:  Noncontributory.  PHYSICAL EXAMINATION:  VITAL SIGNS:  The patient is afebrile.  Stable vital signs. HEENT:  The patient is  normocephalic.  Pupils equal, round, and reactive to light and accommodation.  Extraocular movements were intact.  Sclerae and conjunctivae clear.  Oropharynx clear. NECK:  Without thyromegaly. BREASTS:  Not examined. LUNGS:  Clear. CARDIOVASCULAR:  Regular rate.  No murmurs or gallops. ABDOMEN:  Benign.  No mass, organomegaly, or tenderness. PELVIC:  Normal external genitalia.  Vaginal mucosa is clear.  Cervix unremarkable.  Uterus upper limits of normal size.  Moderately tender. Adnexa unremarkable. EXTREMITIES:  Trace edema. NEUROLOGIC:  Grossly within normal limits.  IMPRESSION: 1. Menorrhagia/dysmenorrhea, probably adenomyosis. 2. Previous laparoscopic finding of endometriosis.  PLAN:  The patient will undergo a laparoscopic-assisted vaginal hysterectomy through both fallopian tubes.  Risks of surgery have been discussed including the risk of infection.  Risk of hemorrhage, could require transfusion with the risk of AIDS or hepatitis.  Risk of injury to adjacent organs including bladder, bowel, blood vessels that could require further exploratory surgery.  Risk of deep venous thrombosis and pulmonary embolus.  The patient expressed understanding of potential indications, risk, and alternatives.     Juluis MireJohn S. Adrianne Shackleton, M.D.     JSM/MEDQ  D:  10/12/2016  T:  10/12/2016  Job:  [id number]

## 2016-10-12 NOTE — H&P (Signed)
Patient name [name], [name] DICTATION# [id number] CSN# [id number]  [facility]Margarethe Virgen S, MD 10/12/2016 9:02 AM

## 2016-10-24 ENCOUNTER — Encounter (HOSPITAL_BASED_OUTPATIENT_CLINIC_OR_DEPARTMENT_OTHER): Payer: Self-pay | Admitting: *Deleted

## 2016-10-25 ENCOUNTER — Encounter (HOSPITAL_BASED_OUTPATIENT_CLINIC_OR_DEPARTMENT_OTHER): Payer: Self-pay | Admitting: *Deleted

## 2016-10-25 NOTE — Progress Notes (Signed)
NPO AFTER MN.  ARRIVE AT 0530.  NEEDS CBC, SERUM PREG. , AND T&S.

## 2016-10-30 ENCOUNTER — Encounter (HOSPITAL_COMMUNITY)
Admission: RE | Disposition: A | Discharge status: Home / Self Care | Payer: Self-pay | Source: Ambulatory Visit | Attending: Obstetrics and Gynecology

## 2016-10-30 ENCOUNTER — Ambulatory Visit (HOSPITAL_BASED_OUTPATIENT_CLINIC_OR_DEPARTMENT_OTHER): Payer: BLUE CROSS/BLUE SHIELD | Admitting: Anesthesiology

## 2016-10-30 ENCOUNTER — Observation Stay (HOSPITAL_BASED_OUTPATIENT_CLINIC_OR_DEPARTMENT_OTHER)
Admission: RE | Admit: 2016-10-30 | Discharge: 2016-10-31 | Disposition: A | Discharge status: Home / Self Care | Payer: BLUE CROSS/BLUE SHIELD | Source: Ambulatory Visit | Attending: Obstetrics and Gynecology | Admitting: Obstetrics and Gynecology

## 2016-10-30 ENCOUNTER — Encounter (HOSPITAL_BASED_OUTPATIENT_CLINIC_OR_DEPARTMENT_OTHER): Payer: Self-pay | Admitting: *Deleted

## 2016-10-30 DIAGNOSIS — Z90710 Acquired absence of both cervix and uterus: Secondary | ICD-10-CM

## 2016-10-30 DIAGNOSIS — Z8742 Personal history of other diseases of the female genital tract: Secondary | ICD-10-CM | POA: Diagnosis not present

## 2016-10-30 DIAGNOSIS — N72 Inflammatory disease of cervix uteri: Secondary | ICD-10-CM | POA: Diagnosis not present

## 2016-10-30 DIAGNOSIS — F17210 Nicotine dependence, cigarettes, uncomplicated: Secondary | ICD-10-CM | POA: Diagnosis not present

## 2016-10-30 DIAGNOSIS — N920 Excessive and frequent menstruation with regular cycle: Secondary | ICD-10-CM | POA: Diagnosis not present

## 2016-10-30 DIAGNOSIS — N946 Dysmenorrhea, unspecified: Secondary | ICD-10-CM | POA: Insufficient documentation

## 2016-10-30 HISTORY — DX: Pelvic and perineal pain: R10.2

## 2016-10-30 HISTORY — DX: Anxiety disorder, unspecified: F41.9

## 2016-10-30 HISTORY — DX: Simple chronic bronchitis: J41.0

## 2016-10-30 HISTORY — DX: Xerosis cutis: L85.3

## 2016-10-30 HISTORY — PX: LAPAROSCOPIC VAGINAL HYSTERECTOMY WITH SALPINGECTOMY: SHX6680

## 2016-10-30 HISTORY — DX: Personal history of urinary calculi: Z87.442

## 2016-10-30 HISTORY — DX: Acquired absence of both cervix and uterus: Z90.710

## 2016-10-30 HISTORY — DX: Excessive and frequent menstruation with regular cycle: N92.0

## 2016-10-30 LAB — ABO/RH: ABO/RH(D): A POS

## 2016-10-30 LAB — CBC
HCT: 43.6 % (ref 36.0–46.0)
Hemoglobin: 15.6 g/dL — ABNORMAL HIGH (ref 12.0–15.0)
MCH: 32.6 pg (ref 26.0–34.0)
MCHC: 35.8 g/dL (ref 30.0–36.0)
MCV: 91 fL (ref 78.0–100.0)
PLATELETS: 217 10*3/uL (ref 150–400)
RBC: 4.79 MIL/uL (ref 3.87–5.11)
RDW: 13.3 % (ref 11.5–15.5)
WBC: 6.3 10*3/uL (ref 4.0–10.5)

## 2016-10-30 LAB — TYPE AND SCREEN
ABO/RH(D): A POS
Antibody Screen: NEGATIVE

## 2016-10-30 LAB — HCG, SERUM, QUALITATIVE: Preg, Serum: NEGATIVE

## 2016-10-30 SURGERY — HYSTERECTOMY, VAGINAL, LAPAROSCOPY-ASSISTED, WITH SALPINGECTOMY
Anesthesia: General | Laterality: Bilateral

## 2016-10-30 MED ORDER — LACTATED RINGERS IV SOLN
INTRAVENOUS | Status: DC
  Administered 2016-10-30 (×2): via INTRAVENOUS
  Filled 2016-10-30: qty 1000

## 2016-10-30 MED ORDER — FENTANYL CITRATE (PF) 100 MCG/2ML IJ SOLN
INTRAMUSCULAR | Status: DC | PRN
  Administered 2016-10-30: 100 ug via INTRAVENOUS
  Administered 2016-10-30 (×3): 50 ug via INTRAVENOUS
  Administered 2016-10-30: 100 ug via INTRAVENOUS

## 2016-10-30 MED ORDER — ACETAMINOPHEN 325 MG PO TABS
650.0000 mg | ORAL_TABLET | ORAL | Status: DC | PRN
  Filled 2016-10-30: qty 2

## 2016-10-30 MED ORDER — MIDAZOLAM HCL 5 MG/5ML IJ SOLN
INTRAMUSCULAR | Status: DC | PRN
  Administered 2016-10-30: 2 mg via INTRAVENOUS

## 2016-10-30 MED ORDER — OXYCODONE HCL 5 MG PO TABS
5.0000 mg | ORAL_TABLET | Freq: Once | ORAL | Status: DC | PRN
  Filled 2016-10-30: qty 1

## 2016-10-30 MED ORDER — EPHEDRINE 5 MG/ML INJ
INTRAVENOUS | Status: AC
  Filled 2016-10-30: qty 10

## 2016-10-30 MED ORDER — BUPIVACAINE HCL (PF) 0.25 % IJ SOLN
INTRAMUSCULAR | Status: AC
Start: 2016-10-30 — End: 2016-10-30
  Filled 2016-10-30: qty 30

## 2016-10-30 MED ORDER — MENTHOL 3 MG MT LOZG
1.0000 | LOZENGE | OROMUCOSAL | Status: DC | PRN
  Filled 2016-10-30: qty 9

## 2016-10-30 MED ORDER — KETOROLAC TROMETHAMINE 30 MG/ML IJ SOLN
30.0000 mg | Freq: Once | INTRAMUSCULAR | Status: DC | PRN
  Filled 2016-10-30: qty 1

## 2016-10-30 MED ORDER — HYDROMORPHONE HCL 1 MG/ML IJ SOLN
0.2500 mg | INTRAMUSCULAR | Status: DC | PRN
  Administered 2016-10-30 (×2): 0.5 mg via INTRAVENOUS
  Filled 2016-10-30: qty 0.5

## 2016-10-30 MED ORDER — HYDROMORPHONE 1 MG/ML IV SOLN
INTRAVENOUS | Status: DC
  Administered 2016-10-30: 2 mg via INTRAVENOUS
  Administered 2016-10-30: 11:00:00 via INTRAVENOUS
  Administered 2016-10-30: 1.8 mg via INTRAVENOUS
  Administered 2016-10-31: 3.7 mg via INTRAVENOUS
  Administered 2016-10-31: 3.4 mg via INTRAVENOUS
  Filled 2016-10-30 (×2): qty 25

## 2016-10-30 MED ORDER — ONDANSETRON HCL 4 MG/2ML IJ SOLN
INTRAMUSCULAR | Status: AC
  Filled 2016-10-30: qty 2

## 2016-10-30 MED ORDER — CEFAZOLIN SODIUM-DEXTROSE 2-4 GM/100ML-% IV SOLN
INTRAVENOUS | Status: AC
  Filled 2016-10-30: qty 100

## 2016-10-30 MED ORDER — MIDAZOLAM HCL 2 MG/2ML IJ SOLN
INTRAMUSCULAR | Status: AC
  Filled 2016-10-30: qty 2

## 2016-10-30 MED ORDER — PROMETHAZINE HCL 25 MG/ML IJ SOLN
6.2500 mg | INTRAMUSCULAR | Status: DC | PRN
  Filled 2016-10-30: qty 1

## 2016-10-30 MED ORDER — FENTANYL CITRATE (PF) 250 MCG/5ML IJ SOLN
INTRAMUSCULAR | Status: AC
  Filled 2016-10-30: qty 5

## 2016-10-30 MED ORDER — FENTANYL CITRATE (PF) 100 MCG/2ML IJ SOLN
INTRAMUSCULAR | Status: AC
  Filled 2016-10-30: qty 2

## 2016-10-30 MED ORDER — DEXAMETHASONE SODIUM PHOSPHATE 4 MG/ML IJ SOLN
INTRAMUSCULAR | Status: DC | PRN
  Administered 2016-10-30: 10 mg via INTRAVENOUS

## 2016-10-30 MED ORDER — BUPIVACAINE HCL (PF) 0.25 % IJ SOLN
INTRAMUSCULAR | Status: DC | PRN
  Administered 2016-10-30: 5 mL

## 2016-10-30 MED ORDER — PROPOFOL 500 MG/50ML IV EMUL
INTRAVENOUS | Status: AC
  Filled 2016-10-30: qty 50

## 2016-10-30 MED ORDER — DEXAMETHASONE SODIUM PHOSPHATE 10 MG/ML IJ SOLN
INTRAMUSCULAR | Status: AC
  Filled 2016-10-30: qty 1

## 2016-10-30 MED ORDER — HEPARIN SOD (PORK) LOCK FLUSH 100 UNIT/ML IV SOLN
INTRAVENOUS | Status: AC
  Filled 2016-10-30: qty 5

## 2016-10-30 MED ORDER — PROPOFOL 10 MG/ML IV BOLUS
INTRAVENOUS | Status: DC | PRN
  Administered 2016-10-30: 150 mg via INTRAVENOUS

## 2016-10-30 MED ORDER — SUGAMMADEX SODIUM 200 MG/2ML IV SOLN
INTRAVENOUS | Status: AC
  Filled 2016-10-30: qty 2

## 2016-10-30 MED ORDER — LACTATED RINGERS IR SOLN
Status: DC | PRN
  Administered 2016-10-30: 3000 mL

## 2016-10-30 MED ORDER — OXYCODONE HCL 5 MG/5ML PO SOLN
5.0000 mg | Freq: Once | ORAL | Status: DC | PRN
  Filled 2016-10-30: qty 5

## 2016-10-30 MED ORDER — OXYCODONE-ACETAMINOPHEN 5-325 MG PO TABS
1.0000 | ORAL_TABLET | ORAL | Status: DC | PRN
  Filled 2016-10-30: qty 2

## 2016-10-30 MED ORDER — LIDOCAINE 2% (20 MG/ML) 5 ML SYRINGE
INTRAMUSCULAR | Status: AC
  Filled 2016-10-30: qty 5

## 2016-10-30 MED ORDER — ROCURONIUM BROMIDE 50 MG/5ML IV SOSY
PREFILLED_SYRINGE | INTRAVENOUS | Status: AC
  Filled 2016-10-30: qty 5

## 2016-10-30 MED ORDER — DIPHENHYDRAMINE HCL 12.5 MG/5ML PO ELIX
12.5000 mg | ORAL_SOLUTION | Freq: Four times a day (QID) | ORAL | Status: DC | PRN
  Filled 2016-10-30: qty 5

## 2016-10-30 MED ORDER — SUGAMMADEX SODIUM 500 MG/5ML IV SOLN
INTRAVENOUS | Status: AC
  Filled 2016-10-30: qty 10

## 2016-10-30 MED ORDER — KETOROLAC TROMETHAMINE 30 MG/ML IJ SOLN
INTRAMUSCULAR | Status: DC | PRN
  Administered 2016-10-30: 30 mg via INTRAVENOUS

## 2016-10-30 MED ORDER — PROMETHAZINE HCL 25 MG/ML IJ SOLN: 12.5000 mg | INTRAMUSCULAR | Status: DC | PRN

## 2016-10-30 MED ORDER — CEFAZOLIN SODIUM-DEXTROSE 2-4 GM/100ML-% IV SOLN
2.0000 g | INTRAVENOUS | Status: AC
  Administered 2016-10-30: 2 g via INTRAVENOUS
  Filled 2016-10-30: qty 100

## 2016-10-30 MED ORDER — LACTATED RINGERS IV SOLN
INTRAVENOUS | Status: DC
  Administered 2016-10-30 – 2016-10-31 (×3): via INTRAVENOUS
  Filled 2016-10-30: qty 1000

## 2016-10-30 MED ORDER — LIDOCAINE 2% (20 MG/ML) 5 ML SYRINGE
INTRAMUSCULAR | Status: DC | PRN
  Administered 2016-10-30: 60 mg via INTRAVENOUS

## 2016-10-30 MED ORDER — ROCURONIUM BROMIDE 10 MG/ML (PF) SYRINGE
PREFILLED_SYRINGE | INTRAVENOUS | Status: DC | PRN
  Administered 2016-10-30: 10 mg via INTRAVENOUS
  Administered 2016-10-30: 40 mg via INTRAVENOUS

## 2016-10-30 MED ORDER — NALOXONE HCL 0.4 MG/ML IJ SOLN
0.4000 mg | INTRAMUSCULAR | Status: DC | PRN
  Filled 2016-10-30: qty 1

## 2016-10-30 MED ORDER — DIPHENHYDRAMINE HCL 50 MG/ML IJ SOLN
12.5000 mg | Freq: Four times a day (QID) | INTRAMUSCULAR | Status: DC | PRN
  Filled 2016-10-30: qty 0.25

## 2016-10-30 MED ORDER — SUGAMMADEX SODIUM 200 MG/2ML IV SOLN
INTRAVENOUS | Status: DC | PRN
  Administered 2016-10-30: 200 mg via INTRAVENOUS

## 2016-10-30 MED ORDER — HYDROMORPHONE HCL 1 MG/ML IJ SOLN
INTRAMUSCULAR | Status: AC
  Filled 2016-10-30: qty 1

## 2016-10-30 MED ORDER — SODIUM CHLORIDE 0.9% FLUSH
9.0000 mL | INTRAVENOUS | Status: DC | PRN
  Filled 2016-10-30: qty 10

## 2016-10-30 SURGICAL SUPPLY — 81 items
APPLICATOR COTTON TIP 6IN STRL (MISCELLANEOUS) ×3 IMPLANT
BANDAGE ADH SHEER 1  50/CT (GAUZE/BANDAGES/DRESSINGS) ×3 IMPLANT
BANDAGE ADHESIVE 1X3 (GAUZE/BANDAGES/DRESSINGS) IMPLANT
BLADE CLIPPER SURG (BLADE) IMPLANT
BLADE SURG 10 STRL SS (BLADE) ×3 IMPLANT
BLADE SURG 11 STRL SS (BLADE) ×3 IMPLANT
CANISTER SUCT 3000ML PPV (MISCELLANEOUS) ×6 IMPLANT
CATH FOLEY 2WAY SLVR  5CC 16FR (CATHETERS) ×2
CATH FOLEY 2WAY SLVR 5CC 16FR (CATHETERS) ×1 IMPLANT
CATH ROBINSON RED A/P 16FR (CATHETERS) ×3 IMPLANT
CLOSURE WOUND 1/4X4 (GAUZE/BANDAGES/DRESSINGS) ×1
COVER BACK TABLE 60X90IN (DRAPES) ×6 IMPLANT
COVER MAYO STAND STRL (DRAPES) ×6 IMPLANT
DERMABOND ADVANCED (GAUZE/BANDAGES/DRESSINGS) ×2
DERMABOND ADVANCED .7 DNX12 (GAUZE/BANDAGES/DRESSINGS) ×1 IMPLANT
DRAPE LG THREE QUARTER DISP (DRAPES) ×3 IMPLANT
DRAPE UNDERBUTTOCKS STRL (DRAPE) ×3 IMPLANT
DRSG COVADERM PLUS 2X2 (GAUZE/BANDAGES/DRESSINGS) ×3 IMPLANT
ELECT REM PT RETURN 9FT ADLT (ELECTROSURGICAL) ×3
ELECTRODE REM PT RTRN 9FT ADLT (ELECTROSURGICAL) ×1 IMPLANT
FILTER SMOKE EVAC LAPAROSHD (FILTER) IMPLANT
GLOVE BIO SURGEON STRL SZ7 (GLOVE) ×12 IMPLANT
GLOVE BIOGEL PI IND STRL 6.5 (GLOVE) ×4 IMPLANT
GLOVE BIOGEL PI INDICATOR 6.5 (GLOVE) ×8
GOWN STRL REUS W/ TWL LRG LVL3 (GOWN DISPOSABLE) ×4 IMPLANT
GOWN STRL REUS W/ TWL XL LVL3 (GOWN DISPOSABLE) ×1 IMPLANT
GOWN STRL REUS W/TWL LRG LVL3 (GOWN DISPOSABLE) ×8
GOWN STRL REUS W/TWL XL LVL3 (GOWN DISPOSABLE) ×2
HOLDER FOLEY CATH W/STRAP (MISCELLANEOUS) ×3 IMPLANT
KIT RM TURNOVER CYSTO AR (KITS) ×3 IMPLANT
NEEDLE HYPO 22GX1.5 SAFETY (NEEDLE) ×3 IMPLANT
NEEDLE INSUFFLATION 14GA 120MM (NEEDLE) IMPLANT
NEEDLE INSUFFLATION 14GA 150MM (NEEDLE) IMPLANT
NS IRRIG 500ML POUR BTL (IV SOLUTION) ×3 IMPLANT
PACK BASIN DAY SURGERY FS (CUSTOM PROCEDURE TRAY) ×3 IMPLANT
PAD OB MATERNITY 4.3X12.25 (PERSONAL CARE ITEMS) ×3 IMPLANT
PAD PREP 24X48 CUFFED NSTRL (MISCELLANEOUS) ×3 IMPLANT
PADDING ION DISPOSABLE (MISCELLANEOUS) ×3 IMPLANT
PENCIL BUTTON HOLSTER BLD 10FT (ELECTRODE) ×3 IMPLANT
POUCH SPECIMEN RETRIEVAL 10MM (ENDOMECHANICALS) IMPLANT
SCISSORS LAP 5X35 DISP (ENDOMECHANICALS) IMPLANT
SCISSORS LAP 5X45 EPIX DISP (ENDOMECHANICALS) IMPLANT
SEALER TISSUE G2 CVD JAW 45CM (ENDOMECHANICALS) ×3 IMPLANT
SET IRRIG TUBING LAPAROSCOPIC (IRRIGATION / IRRIGATOR) ×3 IMPLANT
SET IRRIG Y TYPE TUR BLADDER L (SET/KITS/TRAYS/PACK) IMPLANT
SHEET LAVH (DRAPES) ×3 IMPLANT
SOLUTION ANTI FOG 6CC (MISCELLANEOUS) ×3 IMPLANT
SOLUTION ELECTROLUBE (MISCELLANEOUS) IMPLANT
SPONGE GAUZE 2X2 8PLY STER LF (GAUZE/BANDAGES/DRESSINGS)
SPONGE GAUZE 2X2 8PLY STRL LF (GAUZE/BANDAGES/DRESSINGS) IMPLANT
SPONGE LAP 4X18 X RAY DECT (DISPOSABLE) ×3 IMPLANT
STRIP CLOSURE SKIN 1/4X4 (GAUZE/BANDAGES/DRESSINGS) ×2 IMPLANT
SUT MNCRL AB 4-0 PS2 18 (SUTURE) ×3 IMPLANT
SUT MON AB 2-0 CT1 36 (SUTURE) ×3 IMPLANT
SUT VIC AB 0 CT1 18XCR BRD8 (SUTURE) ×2 IMPLANT
SUT VIC AB 0 CT1 36 (SUTURE) ×6 IMPLANT
SUT VIC AB 0 CT1 8-18 (SUTURE) ×4
SUT VIC AB 2-0 CT1 (SUTURE) IMPLANT
SUT VIC AB 2-0 SH 27 (SUTURE)
SUT VIC AB 2-0 SH 27XBRD (SUTURE) IMPLANT
SUT VIC AB 3-0 SH 27 (SUTURE)
SUT VIC AB 3-0 SH 27X BRD (SUTURE) IMPLANT
SUT VICRYL 0 UR6 27IN ABS (SUTURE) IMPLANT
SUT VICRYL 1 TIES 12X18 (SUTURE) ×3 IMPLANT
SUT VICRYL 4-0 PS2 18IN ABS (SUTURE) ×3 IMPLANT
SYR BULB IRRIGATION 50ML (SYRINGE) ×3 IMPLANT
SYR CONTROL 10ML LL (SYRINGE) ×3 IMPLANT
SYRINGE 10CC LL (SYRINGE) ×3 IMPLANT
TOWEL OR 17X24 6PK STRL BLUE (TOWEL DISPOSABLE) ×6 IMPLANT
TRAY DSU PREP LF (CUSTOM PROCEDURE TRAY) ×3 IMPLANT
TRAY FOLEY CATH SILVER 14FR (SET/KITS/TRAYS/PACK) ×3 IMPLANT
TROCAR BALLN 12MMX100 BLUNT (TROCAR) ×3 IMPLANT
TROCAR OPTI TIP 5M 100M (ENDOMECHANICALS) ×3 IMPLANT
TROCAR XCEL 12X100 BLDLESS (ENDOMECHANICALS) IMPLANT
TROCAR XCEL DIL TIP R 11M (ENDOMECHANICALS) IMPLANT
TUBE CONNECTING 12'X1/4 (SUCTIONS) ×2
TUBE CONNECTING 12X1/4 (SUCTIONS) ×4 IMPLANT
TUBING INSUF HEATED (TUBING) ×3 IMPLANT
WARMER LAPAROSCOPE (MISCELLANEOUS) ×3 IMPLANT
WATER STERILE IRR 500ML POUR (IV SOLUTION) ×3 IMPLANT
YANKAUER SUCT BULB TIP NO VENT (SUCTIONS) ×3 IMPLANT

## 2016-10-30 NOTE — Transfer of Care (Signed)
Immediate Anesthesia Transfer of Care Note  Patient: [name]  Procedure(s) Performed: Procedure(s) with comments: LAPAROSCOPIC ASSISTED VAGINAL HYSTERECTOMY WITH SALPINGECTOMY (Bilateral) - need bed  Patient Location: PACU  Anesthesia Type:General  Level of Consciousness: awake, alert , oriented and patient cooperative  Airway & Oxygen Therapy: Patient Spontanous Breathing and Patient connected to nasal cannula oxygen  Post-op Assessment: Report given to RN and Post -op Vital signs reviewed and stable  Post vital signs: Reviewed and stable  Last Vitals:  Vitals:   10/30/16 0556  BP: 114/78  Pulse: 79  Resp: 16  Temp: 36.9 C    Last Pain:  Vitals:   10/30/16 0556  TempSrc: Oral      Patients Stated Pain Goal: 5 (10/30/16 16100605)  Complications: No apparent anesthesia complications

## 2016-10-30 NOTE — H&P (Signed)
  History and physical exam unchanged 

## 2016-10-30 NOTE — Brief Op Note (Signed)
10/30/2016  9:06 AM  PATIENT:  Sheryle Sprayixie M Tolson  31 y.o. female  PRE-OPERATIVE DIAGNOSIS:  pelvic pain, dysmenorrhea, menorrhagia  POST-OPERATIVE DIAGNOSIS:  pelvic pain, dysmenorrhea, menorrhagia  PROCEDURE:  Procedure(s) with comments: LAPAROSCOPIC ASSISTED VAGINAL HYSTERECTOMY WITH SALPINGECTOMY (Bilateral) - need bed  SURGEON:  Surgeon(s) and Role:    * Richardean ChimeraJohn Tyden Kann, MD - Primary    * Ranae PilaElise Jennifer Leger, MD - Assisting  PHYSICIAN ASSISTANT:   ASSISTANTS: leger   ANESTHESIA:   general  EBL:  Total I/O In: 1200 [I.V.:1200] Out: 150 [Urine:50; Blood:100]  BLOOD ADMINISTERED:none  DRAINS: Urinary Catheter (Foley)   LOCAL MEDICATIONS USED:  MARCAINE     SPECIMEN:  Source of Specimen:  uterus and both fallopian tubes   DISPOSITION OF SPECIMEN:  PATHOLOGY  COUNTS:  YES  TOURNIQUET:  * No tourniquets in log *  DICTATION: .Other Dictation: Dictation Number [id number]  PLAN OF CARE: Admit for overnight observation  PATIENT DISPOSITION:  PACU - hemodynamically stable.   Delay start of Pharmacological VTE agent (>24hrs) due to surgical blood loss or risk of bleeding: no

## 2016-10-30 NOTE — Anesthesia Postprocedure Evaluation (Signed)
Anesthesia Post Note  Patient: [name]  Procedure(s) Performed: Procedure(s) (LRB): LAPAROSCOPIC ASSISTED VAGINAL HYSTERECTOMY WITH SALPINGECTOMY (Bilateral)  Patient location during evaluation: PACU Anesthesia Type: General Level of consciousness: awake and alert Pain management: pain level controlled Vital Signs Assessment: post-procedure vital signs reviewed and stable Respiratory status: spontaneous breathing, nonlabored ventilation, respiratory function stable and patient connected to nasal cannula oxygen Cardiovascular status: blood pressure returned to baseline and stable Postop Assessment: no signs of nausea or vomiting Anesthetic complications: no       Last Vitals:  Vitals:   10/30/16 1000 10/30/16 1018  BP: 119/86   Pulse: (!) 59 67  Resp: 13 12  Temp: 36.5 C     Last Pain:  Vitals:   10/30/16 1018  TempSrc:   PainSc: 6                  Kaavya Puskarich S

## 2016-10-30 NOTE — Progress Notes (Signed)
Patient ID: [name], female   DOB: 1986/04/18, 31 y.o.   MRN: [id number] AF VSS ABD SOFT REINFORCED DRESSING DRY VOIDING MINIMLA BLEEDING

## 2016-10-30 NOTE — Op Note (Signed)
NAMESULY, VUKELICH NO.:  [id number]  MEDICAL RECORD NO.:  [id number]  LOCATION:                                 FACILITY:  PHYSICIAN:  Juluis Mire, M.D.   DATE OF BIRTH:  1985-09-03  DATE OF PROCEDURE:  10/30/2016 DATE OF DISCHARGE:                              OPERATIVE REPORT   PREOPERATIVE DIAGNOSIS:  Menorrhagia with pelvic pain.  POSTOPERATIVE DIAGNOSIS:  Menorrhagia with pelvic pain.  OPERATIVE PROCEDURE:  Laparoscopically-assisted vaginal hysterectomy with removal of both fallopian tubes.  SURGEON:  Juluis Mire, M.D.  ASSISTANT:  Belva Agee.  ANESTHESIA:  General endotracheal.  ESTIMATED BLOOD LOSS:  200 to 300 mL.  PACKS:  None.  DRAINS:  Included urethral Foley.  INTRAOPERATIVE BLOOD PLACED:  None.  COMPLICATIONS:  None.  INDICATIONS:  Dictated in history and physical.  PROCEDURE IN DETAIL:  The patient was taken to the OR and placed in supine position.  After satisfactory level of general endotracheal anesthesia was obtained, the patient was placed in dorsal lithotomy position and used the [employer] stirrups.  The abdomen, perineum, and vagina were then appropriately prepped.  Bladder was emptied by catheterization.  A Hulka tenaculum was put on in the uterus and secured.  The patient was then draped in sterile field.  Subumbilical incision was made with knife, extended through the subcutaneous tissue. Anterior rectus fascia was entered sharply, incision in the fascia extended laterally.  Peritoneum was entered bluntly with blunt finger pressure.  Open laparoscopic trocar was inserted and properly deployed. Laparoscope was introduced.  Visualization with no evidence of injury to adjacent organs.  The upper abdomen including liver, tip of the gallbladder were clear.  Both lateral gutters were clear.  The appendix was visualized, noted to be normal.  Uterus, tubes, and ovaries were also normal.  A 5-mm trocar was put in place in  suprapubic area.  We first went to the right side.  The right fallopian tube was elevated using the EndoSeal.  The right tube was separated from its mesenteric attachments up to the uterus.  The right round ligament was then cauterized, incised, and the right utero-ovarian pedicle was cauterized and incised.  We then went to the left side.  The left tube was elevated.  The mesenteric attachments to the tube were then cauterized, incised up to the uterus.  The left round ligament was cauterized and incised.  The left utero-ovarian pedicle was cauterized and incised.  We had good hemostasis and freeing of the adnexa from the uterus.  We decided to go vaginal at this point in time, the laparoscope was removed and the abdomen was deflated with carbon dioxide.  The legs were repositioned.  The Hulka tenaculum was then removed.  A weighted speculum was placed in the vaginal vault.  The cervix was grasped with Christella Hartigan tenaculum.  Cul-de-sac was entered sharply.  Both uterosacral ligaments were clamped, cut, and suture ligated with 0 Vicryl. Reflection of the vaginal mucosa anteriorly was then incised and bladder was dissected superiorly.  Paracervical tissue was then clamped, cut, and suture ligated with 0 Vicryl.  The Vesicouterine space was identified and entered sharply, and retractor  was put in place to retract the bladder superiorly.  Using the clamp cut and tie technique with suture ligature of 0 Vicryl, the parametrium was serially separated from sides.  The uterus was then flipped.  Remainder of the pedicles were clamped and cut, and uterus passed off the operative field.  On the right side, she did have some arterial bleeders.  These were brought under control with Allis'.  The 2 held pedicles were secured with free ties of 0 Vicryl.  Then, we used suture ligatures to bring the bleeding areas under control.  We had good hemostasis at this point in time. Uterosacral plication stitch was  then put in place.  The posterior vaginal cuff was run with a running locking suture of 0 Vicryl.  The vaginal mucosa was then reapproximated with interrupted figure-of-eights of 0 Vicryl.  The Foley was placed to straight drain.  We had clear urine at this point in time.  At this point in time, the weighted speculum was removed.  The patient was taken out of the dorsal lithotomy position.  Laparoscope was reintroduced.  The abdomen was insufflated with carbon dioxide.  She did have some oozing from the vaginal cuff. This was brought under control with cautery.  We had a small hematoma up the right pelvic sidewall.  We observed this over a period of time.  It did not appear to be expanding even after deflating the abdomen.  We further used cautery along the ovarian pedicles and the mesentery to the tubes, making sure everything was hemostatically intact.  We thoroughly irrigated the pelvis, removed the irrigation.  We had no active bleeding.  We de-inflated the abdomen and re-inflated again.  The hematoma seemed to be stable.  There was no active bleeding.  The abdomen was completely deflated of its carbon dioxide.  The trocar was then removed.  Subumbilical fascia was closed with 2 figure-of-eights of 0 Vicryl.  Skin was closed with interrupted subcuticulars of 4-0 Vicryl. The suprapubic incision was closed with Dermabond.  Sponge, instrument, and needle counts were reported as correct by the circulating nurse x2. Foley catheter remained clear at the time of closure.  The patient tolerated the procedure well and was returned to the recovery room in good condition.     Juluis MireJohn S. Chandlar Guice, M.D.     JSM/MEDQ  D:  10/30/2016  T:  10/30/2016  Job:  [id number]

## 2016-10-30 NOTE — Anesthesia Procedure Notes (Signed)
Procedure Name: LMA Insertion Date/Time: 10/30/2016 7:39 AM Performed by: Wanita Chamberlain Pre-anesthesia Checklist: Patient identified, Timeout performed, Emergency Drugs available, Suction available and Patient being monitored Patient Re-evaluated:Patient Re-evaluated prior to inductionOxygen Delivery Method: Circle system utilized Preoxygenation: Pre-oxygenation with 100% oxygen Intubation Type: IV induction Ventilation: Mask ventilation without difficulty Laryngoscope Size: Mac and 3 Grade View: Grade I Tube type: Oral Tube size: 7.0 mm Number of attempts: 1 Airway Equipment and Method: Stylet Placement Confirmation: ETT inserted through vocal cords under direct vision,  positive ETCO2 and breath sounds checked- equal and bilateral Secured at: 21 cm Tube secured with: Tape Dental Injury: Teeth and Oropharynx as per pre-operative assessment

## 2016-10-30 NOTE — Anesthesia Preprocedure Evaluation (Addendum)
Anesthesia Evaluation  Patient identified by MRN, date of birth, ID band Patient awake    Reviewed: Allergy & Precautions, NPO status , Patient's Chart, lab work & pertinent test results  Airway Mallampati: II  TM Distance: >3 FB Neck ROM: Full    Dental no notable dental hx. (+) Teeth Intact, Dental Advisory Given,    Pulmonary Current Smoker,    Pulmonary exam normal breath sounds clear to auscultation       Cardiovascular negative cardio ROS Normal cardiovascular exam Rhythm:Regular Rate:Normal     Neuro/Psych Anxiety negative neurological ROS  negative psych ROS   GI/Hepatic negative GI ROS, Neg liver ROS,   Endo/Other  negative endocrine ROS  Renal/GU negative Renal ROS  negative genitourinary   Musculoskeletal negative musculoskeletal ROS (+)   Abdominal   Peds negative pediatric ROS (+)  Hematology negative hematology ROS (+)   Anesthesia Other Findings   Reproductive/Obstetrics negative OB ROS                           Anesthesia Physical Anesthesia Plan  ASA: II  Anesthesia Plan: General   Post-op Pain Management:    Induction: Intravenous  Airway Management Planned: Oral ETT  Additional Equipment:   Intra-op Plan:   Post-operative Plan: Extubation in OR  Informed Consent: I have reviewed the patients History and Physical, chart, labs and discussed the procedure including the risks, benefits and alternatives for the proposed anesthesia with the patient or authorized representative who has indicated his/her understanding and acceptance.   Dental advisory given  Plan Discussed with: CRNA and Surgeon  Anesthesia Plan Comments:         Anesthesia Quick Evaluation

## 2016-10-31 ENCOUNTER — Encounter (HOSPITAL_BASED_OUTPATIENT_CLINIC_OR_DEPARTMENT_OTHER): Payer: Self-pay | Admitting: Obstetrics and Gynecology

## 2016-10-31 DIAGNOSIS — N920 Excessive and frequent menstruation with regular cycle: Secondary | ICD-10-CM | POA: Diagnosis not present

## 2016-10-31 LAB — CBC
HCT: 35.3 % — ABNORMAL LOW (ref 36.0–46.0)
Hemoglobin: 12.2 g/dL (ref 12.0–15.0)
MCH: 32.1 pg (ref 26.0–34.0)
MCHC: 34.6 g/dL (ref 30.0–36.0)
MCV: 92.9 fL (ref 78.0–100.0)
PLATELETS: 190 10*3/uL (ref 150–400)
RBC: 3.8 MIL/uL — ABNORMAL LOW (ref 3.87–5.11)
RDW: 13.6 % (ref 11.5–15.5)
WBC: 11.4 10*3/uL — ABNORMAL HIGH (ref 4.0–10.5)

## 2016-10-31 MED ORDER — OXYCODONE-ACETAMINOPHEN 7.5-325 MG PO TABS: 1.0000 | ORAL_TABLET | ORAL | 0 refills | Status: DC | PRN

## 2016-10-31 NOTE — Discharge Summary (Signed)
Patient name  [name], Demaris DICTATION# [id number] CSN# [id number]  [facility],Shakeitha Umbaugh S, MD 10/31/2016 7:31 AM

## 2016-10-31 NOTE — Discharge Summary (Signed)
NAMJake Campbell:  Sadek, Mishell                ACCOUNT NO.:  [id number]  MEDICAL RECORD NO.:  [id number]  LOCATION:                                 FACILITY:  PHYSICIAN:  Juluis MireJohn S. Amyrah Pinkhasov, M.D.        DATE OF BIRTH:  DATE OF ADMISSION:  10/30/2016 DATE OF DISCHARGE:  10/31/2016                              DISCHARGE SUMMARY   ADMITTING DIAGNOSIS:  Menorrhagia and dysmenorrhea probably secondary to adenomyosis.  DISCHARGE DIAGNOSIS:  Menorrhagia and dysmenorrhea probably secondary to adenomyosis.  OPERATIVE PROCEDURE:  Laparoscopic-assisted vaginal hysterectomy with removal of both fallopian tubes.  For a complete history and physical, please see dictated note.  COURSE IN THE HOSPITAL:  The patient underwent above-noted surgery. Postop, did well.  Her Foley was discontinued right after surgery.  She was able to void without difficulty.  The following day, her postop hemoglobin was 12.2.  She was tolerating diet and voiding without difficulty.  Incisions were clear.  Abdomen was soft and nontender. Minimal vaginal bleeding.  In terms of complications, none were encountered during her stay in the hospital.  The patient discharged home in stable condition.  DISPOSITION:  The patient to avoid heavy lifting, vaginal entrance, or driving a car.  She was instructed to call should there be any signs of fever.  Heavy vaginal bleeding should be reported.  Nausea, vomiting should be reported.  Excessive pain should be reported.  Instructed on the signs and symptoms of deep venous thrombosis and pulmonary embolus. She will be discharged home on Percocet as she needs for pain.  PLAN:  Follow up in the office will be in approximately 1 week.     Juluis MireJohn S. Ashland Wiseman, M.D.     JSM/MEDQ  D:  10/31/2016  T:  10/31/2016  Job:  [id number]

## 2016-10-31 NOTE — Progress Notes (Signed)
1 Day Post-Op Procedure(s) (LRB): LAPAROSCOPIC ASSISTED VAGINAL HYSTERECTOMY WITH SALPINGECTOMY (Bilateral)  Subjective: Patient reports tolerating PO and no problems voiding.    Objective: I have reviewed patient's vital signs and labs.  General: alert GI: soft, non-tender; bowel sounds normal; no masses,  no organomegaly and incision: clean Vaginal Bleeding: minimal  Assessment: s/p Procedure(s) with comments: LAPAROSCOPIC ASSISTED VAGINAL HYSTERECTOMY WITH SALPINGECTOMY (Bilateral) - need bed: stable  Plan: Discharge home  LOS: 0 days    Matalynn Graff S 10/31/2016, 7:30 AM

## 2016-11-04 ENCOUNTER — Emergency Department
Admission: EM | Admit: 2016-11-04 | Discharge: 2016-11-04 | Disposition: A | Discharge status: Home / Self Care | Payer: BLUE CROSS/BLUE SHIELD | Attending: Emergency Medicine | Admitting: Emergency Medicine

## 2016-11-04 ENCOUNTER — Encounter: Payer: Self-pay | Admitting: Medical Oncology

## 2016-11-04 DIAGNOSIS — R300 Dysuria: Secondary | ICD-10-CM | POA: Diagnosis not present

## 2016-11-04 DIAGNOSIS — G8918 Other acute postprocedural pain: Secondary | ICD-10-CM | POA: Diagnosis not present

## 2016-11-04 DIAGNOSIS — K5903 Drug induced constipation: Secondary | ICD-10-CM

## 2016-11-04 DIAGNOSIS — R109 Unspecified abdominal pain: Secondary | ICD-10-CM | POA: Diagnosis present

## 2016-11-04 DIAGNOSIS — F17210 Nicotine dependence, cigarettes, uncomplicated: Secondary | ICD-10-CM | POA: Insufficient documentation

## 2016-11-04 LAB — COMPREHENSIVE METABOLIC PANEL
ALBUMIN: 3.6 g/dL (ref 3.5–5.0)
ALT: 9 U/L — AB (ref 14–54)
AST: 16 U/L (ref 15–41)
Alkaline Phosphatase: 52 U/L (ref 38–126)
Anion gap: 8 (ref 5–15)
BILIRUBIN TOTAL: 0.8 mg/dL (ref 0.3–1.2)
BUN: 8 mg/dL (ref 6–20)
CO2: 24 mmol/L (ref 22–32)
CREATININE: 0.73 mg/dL (ref 0.44–1.00)
Calcium: 9.1 mg/dL (ref 8.9–10.3)
Chloride: 101 mmol/L (ref 101–111)
GFR calc Af Amer: >60 mL/min (ref >60)
GLUCOSE: 106 mg/dL — AB (ref 65–99)
POTASSIUM: 4.3 mmol/L (ref 3.5–5.1)
Sodium: 133 mmol/L — ABNORMAL LOW (ref 135–145)
TOTAL PROTEIN: 7 g/dL (ref 6.5–8.1)

## 2016-11-04 LAB — URINALYSIS, COMPLETE (UACMP) WITH MICROSCOPIC
BACTERIA UA: NONE SEEN
BILIRUBIN URINE: NEGATIVE
Glucose, UA: NEGATIVE mg/dL
Hgb urine dipstick: NEGATIVE
KETONES UR: NEGATIVE mg/dL
Leukocytes, UA: NEGATIVE
NITRITE: NEGATIVE
PH: 7 (ref 5.0–8.0)
PROTEIN: NEGATIVE mg/dL
Specific Gravity, Urine: 1.012 (ref 1.005–1.030)

## 2016-11-04 LAB — CBC
HEMATOCRIT: 43.1 % (ref 35.0–47.0)
Hemoglobin: 15.1 g/dL (ref 12.0–16.0)
MCH: 32.2 pg (ref 26.0–34.0)
MCHC: 35.1 g/dL (ref 32.0–36.0)
MCV: 91.7 fL (ref 80.0–100.0)
PLATELETS: 215 10*3/uL (ref 150–440)
RBC: 4.7 MIL/uL (ref 3.80–5.20)
RDW: 13.6 % (ref 11.5–14.5)
WBC: 15.4 10*3/uL — ABNORMAL HIGH (ref 3.6–11.0)

## 2016-11-04 LAB — LIPASE, BLOOD: Lipase: <10 U/L — ABNORMAL LOW (ref 11–51)

## 2016-11-04 MED ORDER — OXYCODONE-ACETAMINOPHEN 7.5-325 MG PO TABS: 1.0000 | ORAL_TABLET | Freq: Four times a day (QID) | ORAL | 0 refills | Status: DC | PRN

## 2016-11-04 MED ORDER — PHENAZOPYRIDINE HCL 200 MG PO TABS: 200.0000 mg | ORAL_TABLET | Freq: Three times a day (TID) | ORAL | 0 refills | Status: DC | PRN

## 2016-11-04 MED ORDER — OXYCODONE-ACETAMINOPHEN 5-325 MG PO TABS
2.0000 | ORAL_TABLET | Freq: Once | ORAL | Status: AC
  Administered 2016-11-04: 2 via ORAL
  Filled 2016-11-04: qty 2

## 2016-11-04 MED ORDER — METHYLNALTREXONE BROMIDE 12 MG/0.6ML ~~LOC~~ SOLN
12.0000 mg | Freq: Once | SUBCUTANEOUS | Status: DC
  Filled 2016-11-04: qty 0.6

## 2016-11-04 MED ORDER — PHENAZOPYRIDINE HCL 200 MG PO TABS
200.0000 mg | ORAL_TABLET | Freq: Once | ORAL | Status: AC
  Administered 2016-11-04: 200 mg via ORAL
  Filled 2016-11-04: qty 1

## 2016-11-04 NOTE — ED Triage Notes (Signed)
Had TAH on Monday at [facility]. States last night or early this morning patient having pain with urinating, able to initiate stream, but feels like bladder is not emptying.  Also s/o pain and being out of pain medicaitons.

## 2016-11-04 NOTE — Discharge Instructions (Signed)
These follow-up with Dr. Arelia Sneddon early this week as scheduled. Return to the emergency department for any new or worsening symptoms such as fever, chills, worsening abdominal pain, or for any other concerns. Pyridium is a very effective medicine for the discomfort with urination, however please note that it will make your urine orange.  It was a pleasure to take care of you today, and thank you for coming to our emergency department.  If you have any questions or concerns before leaving please ask the nurse to grab me and I'm more than happy to go through your aftercare instructions again.  If you were prescribed any opioid pain medication today such as Norco, Vicodin, Percocet, morphine, hydrocodone, or oxycodone please make sure you do not drive when you are taking this medication as it can alter your ability to drive safely.  If you have any concerns once you are home that you are not improving or are in fact getting worse before you can make it to your follow-up appointment, please do not hesitate to call 911 and come back for further evaluation.  Merrily Brittle MD  Results for orders placed or performed during the hospital encounter of 11/04/16  Lipase, blood  Result Value Ref Range   Lipase <10 (L) 11 - 51 U/L  Comprehensive metabolic panel  Result Value Ref Range   Sodium 133 (L) 135 - 145 mmol/L   Potassium 4.3 3.5 - 5.1 mmol/L   Chloride 101 101 - 111 mmol/L   CO2 24 22 - 32 mmol/L   Glucose, Bld 106 (H) 65 - 99 mg/dL   BUN 8 6 - 20 mg/dL   Creatinine, Ser 1.61 0.44 - 1.00 mg/dL   Calcium 9.1 8.9 - 09.6 mg/dL   Total Protein 7.0 6.5 - 8.1 g/dL   Albumin 3.6 3.5 - 5.0 g/dL   AST 16 15 - 41 U/L   ALT 9 (L) 14 - 54 U/L   Alkaline Phosphatase 52 38 - 126 U/L   Total Bilirubin 0.8 0.3 - 1.2 mg/dL   GFR calc non Af Amer >60 >60 mL/min   GFR calc Af Amer >60 >60 mL/min   Anion gap 8 5 - 15  CBC  Result Value Ref Range   WBC 15.4 (H) 3.6 - 11.0 K/uL   RBC 4.70 3.80 - 5.20 MIL/uL     Hemoglobin 15.1 12.0 - 16.0 g/dL   HCT 04.5 40.9 - 81.1 %   MCV 91.7 80.0 - 100.0 fL   MCH 32.2 26.0 - 34.0 pg   MCHC 35.1 32.0 - 36.0 g/dL   RDW 91.4 78.2 - 95.6 %   Platelets 215 150 - 440 K/uL  Urinalysis, Complete w Microscopic  Result Value Ref Range   Color, Urine YELLOW (A) YELLOW   APPearance CLOUDY (A) CLEAR   Specific Gravity, Urine 1.012 1.005 - 1.030   pH 7.0 5.0 - 8.0   Glucose, UA NEGATIVE NEGATIVE mg/dL   Hgb urine dipstick NEGATIVE NEGATIVE   Bilirubin Urine NEGATIVE NEGATIVE   Ketones, ur NEGATIVE NEGATIVE mg/dL   Protein, ur NEGATIVE NEGATIVE mg/dL   Nitrite NEGATIVE NEGATIVE   Leukocytes, UA NEGATIVE NEGATIVE   RBC / HPF 0-5 0 - 5 RBC/hpf   WBC, UA 0-5 0 - 5 WBC/hpf   Bacteria, UA NONE SEEN NONE SEEN   Squamous Epithelial / LPF 0-5 (A) NONE SEEN   Mucous PRESENT    Amorphous Crystal PRESENT

## 2016-11-04 NOTE — ED Notes (Signed)
This RN called to bedside. Explained to patient waiting for pharmacy to send up medication, pt states she had "a good bowel movement". Pt states she does not want the medication at this time.

## 2016-11-04 NOTE — ED Notes (Signed)
Pt able to give a urine specimen, bladder scanned immediately after urination, post void residual of < 49ml per bladder scan.

## 2016-11-04 NOTE — ED Notes (Signed)
NAD noted at time of D/C. Pt denies questions or concerns. Pt ambulatory to the lobby at this time.  Pt refused wheelchair to the lobby at this time.  

## 2016-11-04 NOTE — ED Notes (Signed)
Pt also c/o constipation since Monday, states last bowel movement was yesterday 11/03/2016.

## 2016-11-04 NOTE — ED Provider Notes (Signed)
[facility] Emergency Department Provider Note  ____________________________________________   First MD Initiated Contact with Patient 11/04/16 1513     (approximate)  I have reviewed the triage vital signs and the nursing notes.   HISTORY  Chief Complaint Abdominal Pain    HPI [name] is a 31 y.o. female who self presents to the emergency department with 3 issues. 5 days ago she had a laparoscopic hysterectomy and bilateral oophorectomy for endometriosis performed by Dr. Arelia Sneddon.  She says that since the surgery she is only passed to hard stools and finds it difficult to defecate and has had difficulty passing flatus. She has tried laxatives and enemas at home with no relief. She also reports 1 day of dysuria frequency and burning/stinging sensation. This pain is moderate nonradiating nothing makes it better. Worsened by urination. She says she is concerned that she may have another kidney stone. She has no flank pain. No fevers or chills. He reports completing her course of 30 tablets of 7.5 mg Percocet yesterday and she is still having persistent aching lower abdominal discomfort after the surgery.   Past Medical History:  Diagnosis Date  . Anxiety   . Dry skin    stomach rash  . Endometriosis   . History of kidney stones   . History of kidney stones   . Menorrhagia   . Pelvic pain   . Smokers' cough [facility])     Patient Active Problem List   Diagnosis Date Noted  . S/P laparoscopic assisted vaginal hysterectomy (LAVH) 10/30/2016  . Endometriosis 05/15/2012    Class: Present on Admission    Past Surgical History:  Procedure Laterality Date  . EXTRACORPOREAL SHOCK WAVE LITHOTRIPSY  2014 approx.  Marland Kitchen EYE SURGERY Left age 2  . INGUINAL HERNIA REPAIR Bilateral 2006  . LAPAROSCOPIC CHOLECYSTECTOMY  2006  . LAPAROSCOPIC VAGINAL HYSTERECTOMY WITH SALPINGECTOMY Bilateral 10/30/2016   Procedure: LAPAROSCOPIC ASSISTED VAGINAL HYSTERECTOMY WITH  SALPINGECTOMY;  Surgeon: Richardean Chimera, MD;  Location: [facility];  Service: Gynecology;  Laterality: Bilateral;  need bed  . LAPAROSCOPY  05/15/2012   Procedure: LAPAROSCOPY OPERATIVE;  Surgeon: Juluis Mire, MD;  Location: [facility];  Service: Gynecology;  Laterality: N/A;  . WISDOM TOOTH EXTRACTION      Prior to Admission medications   Medication Sig Start Date End Date Taking? Authorizing Provider  ibuprofen (ADVIL,MOTRIN) 600 MG tablet Take 1 tablet (600 mg total) by mouth every 8 (eight) hours as needed. Patient taking differently: Take 600 mg by mouth every 8 (eight) hours as needed for moderate pain.  08/28/16   Joni Reining, PA-C  oxyCODONE-acetaminophen (PERCOCET) 7.5-325 MG tablet Take 1 tablet by mouth every 6 (six) hours as needed for severe pain. 11/04/16   Merrily Brittle, MD  phenazopyridine (PYRIDIUM) 200 MG tablet Take 1 tablet (200 mg total) by mouth 3 (three) times daily as needed for pain. 11/04/16 11/04/17  Merrily Brittle, MD  traMADol (ULTRAM) 50 MG tablet Take 1 tablet (50 mg total) by mouth every 6 (six) hours as needed. Patient taking differently: Take 50 mg by mouth every 6 (six) hours as needed for moderate pain.  08/28/16 08/28/17  Joni Reining, PA-C    Allergies Ivp dye [iodinated diagnostic agents] and Zofran [ondansetron hcl]  No family history on file.  Social History Social History  Substance Use Topics  . Smoking status: Current Every Day Smoker    Packs/day: 2.00    Years: 22.00  Types: Cigarettes  . Smokeless tobacco: Never Used     Comment: since age 50  . Alcohol use Yes     Comment: ocassional    Review of Systems Constitutional: No fever/chills Eyes: No visual changes. ENT: No sore throat. Cardiovascular: Denies chest pain. Respiratory: Denies shortness of breath. Gastrointestinal: Positive abdominal pain.  No nausea, no vomiting.  No diarrhea.  Positive constipation. Genitourinary: Positive for dysuria. Musculoskeletal:  Negative for back pain. Skin: Negative for rash. Neurological: Negative for headaches, focal weakness or numbness.  10-point ROS otherwise negative.  ____________________________________________   PHYSICAL EXAM:  VITAL SIGNS: ED Triage Vitals [11/04/16 1428]  Enc Vitals Group     BP 96/62     Pulse Rate 97     Resp 18     Temp 98.5 F (36.9 C)     Temp Source Oral     SpO2 96 %     Weight 129 lb (58.5 kg)     Height  (1.727 m)     Head Circumference      Peak Flow      Pain Score 10     Pain Loc      Pain Edu?      Excl. in GC?     Constitutional: Alert and oriented x 4 well appearing nontoxic no diaphoresis speaks in full, clear sentences Eyes: PERRL EOMI. Head: Atraumatic. Nose: No congestion/rhinnorhea. Mouth/Throat: No trismus Neck: No stridor.   Cardiovascular: Normal rate, regular rhythm. Grossly normal heart sounds.  Good peripheral circulation. Respiratory: Normal respiratory effort.  No retractions. Lungs CTAB and moving good air Gastrointestinal: Soft Nondistended minimal diffuse tenderness without focality. No rebound no guarding no peritonitis no costovertebral tenderness Musculoskeletal: No lower extremity edema   Neurologic:  Normal speech and language. No gross focal neurologic deficits are appreciated. Skin:  Skin is warm, dry and intact. No rash noted. Psychiatric: Mood and affect are normal. Speech and behavior are normal.    ____________________________________________   DIFFERENTIAL  Constipation, dehydration, kidney stone, urinary tract infection, post operative infection ____________________________________________   LABS (all labs ordered are listed, but only abnormal results are displayed)  Labs Reviewed  LIPASE, BLOOD - Abnormal; Notable for the following:       Result Value   Lipase <10 (*)    All other components within normal limits  COMPREHENSIVE METABOLIC PANEL - Abnormal; Notable for the following:    Sodium 133 (*)      Glucose, Bld 106 (*)    ALT 9 (*)    All other components within normal limits  CBC - Abnormal; Notable for the following:    WBC 15.4 (*)    All other components within normal limits  URINALYSIS, COMPLETE (UACMP) WITH MICROSCOPIC - Abnormal; Notable for the following:    Color, Urine YELLOW (*)    APPearance CLOUDY (*)    Squamous Epithelial / LPF 0-5 (*)    All other components within normal limits  URINE CULTURE    Urinalysis with no evidence of infection or blood. __________________________________________  EKG   ____________________________________________  RADIOLOGY   ____________________________________________   PROCEDURES  Procedure(s) performed: no  Procedures  Critical Care performed: no  ____________________________________________   INITIAL IMPRESSION / ASSESSMENT AND PLAN / ED COURSE  Pertinent labs & imaging results that were available during my care of the patient were reviewed by me and considered in my medical decision making (see chart for details).  By the time I saw the patient she  had artery had labs and a urinalysis which are all reassuring. No evidence of infection or kidney stone. The patient's primary concern when she started talking to me was the constipation which is likely related to that opioid she received while getting the surgery and the 30 tablets of Percocet she has taken in the last 5 days. She is not obstructed at this point. I'll give her a shot of relistor as she's already failed enemas and laxatives. Regarding her dysuria with no evidence of infection I'll send off a culture and then treat her with Pyridium. Her abdomen is benign and I do not believe she has a postsurgical complication. I had a lengthy discussion with the patient regarding the use of opiate pain medications after surgery. I checked the [place] controlled substance reporting system and she has received no other prescriptions for opiates aside from this one.  If the patient was taking her Percocet every 4 hours for 5 days that were equal 30 tablets.    ----------------------------------------- 5:23 PM on 11/04/2016 -----------------------------------------  The patient had a large bowel movement and no longer wants the relistor.  Her pain is improved and she would like to go home. She is discharged in improved condition.  ____________________________________________   FINAL CLINICAL IMPRESSION(S) / ED DIAGNOSES  Final diagnoses:  Dysuria  Post-operative pain  Drug-induced constipation      NEW MEDICATIONS STARTED DURING THIS VISIT:  New Prescriptions   OXYCODONE-ACETAMINOPHEN (PERCOCET) 7.5-325 MG TABLET    Take 1 tablet by mouth every 6 (six) hours as needed for severe pain.   PHENAZOPYRIDINE (PYRIDIUM) 200 MG TABLET    Take 1 tablet (200 mg total) by mouth 3 (three) times daily as needed for pain.     Note:  This document was prepared using Dragon voice recognition software and may include unintentional dictation errors.     Merrily Brittle, MD 11/04/16 [phone]

## 2016-11-06 LAB — URINE CULTURE

## 2017-01-08 NOTE — Addendum Note (Signed)
Addendum  created 01/08/17 1241 by Tyreek Clabo, MD   Sign clinical note    

## 2017-01-08 NOTE — Anesthesia Postprocedure Evaluation (Signed)
Anesthesia Post Note  Patient: [name]  Procedure(s) Performed: Procedure(s) (LRB): LAPAROSCOPIC ASSISTED VAGINAL HYSTERECTOMY WITH SALPINGECTOMY (Bilateral)     Anesthesia Post Evaluation  Last Vitals:  Vitals:   10/31/16 0615 10/31/16 0800  BP: 109/74   Pulse: 62   Resp: 17 15  Temp: 36.7 C     Last Pain:  Vitals:   10/31/16 0800  TempSrc:   PainSc: 7                  Galileo Colello S

## 2017-01-09 ENCOUNTER — Emergency Department
Admission: EM | Admit: 2017-01-09 | Discharge: 2017-01-09 | Disposition: A | Discharge status: Home / Self Care | Payer: BLUE CROSS/BLUE SHIELD | Attending: Emergency Medicine | Admitting: Emergency Medicine

## 2017-01-09 DIAGNOSIS — N898 Other specified noninflammatory disorders of vagina: Secondary | ICD-10-CM

## 2017-01-09 DIAGNOSIS — F17210 Nicotine dependence, cigarettes, uncomplicated: Secondary | ICD-10-CM | POA: Insufficient documentation

## 2017-01-09 LAB — WET PREP, GENITAL
CLUE CELLS WET PREP: NONE SEEN
SPERM: NONE SEEN
TRICH WET PREP: NONE SEEN
WBC WET PREP: NONE SEEN
YEAST WET PREP: NONE SEEN

## 2017-01-09 LAB — URINALYSIS, COMPLETE (UACMP) WITH MICROSCOPIC
BACTERIA UA: NONE SEEN
BILIRUBIN URINE: NEGATIVE
GLUCOSE, UA: NEGATIVE mg/dL
HGB URINE DIPSTICK: NEGATIVE
Ketones, ur: NEGATIVE mg/dL
LEUKOCYTES UA: NEGATIVE
NITRITE: NEGATIVE
PH: 6 (ref 5.0–8.0)
Protein, ur: NEGATIVE mg/dL
SPECIFIC GRAVITY, URINE: 1.02 (ref 1.005–1.030)

## 2017-01-09 LAB — CHLAMYDIA/NGC RT PCR (ARMC ONLY)
Chlamydia Tr: NOT DETECTED
N gonorrhoeae: NOT DETECTED

## 2017-01-09 NOTE — ED Provider Notes (Signed)
[facility] Emergency Department Provider Note   ____________________________________________   First MD Initiated Contact with Patient 01/09/17 1333     (approximate)  I have reviewed the triage vital signs and the nursing notes.   HISTORY  Chief Complaint Vaginal Itching    HPI [name] is a 31 y.o. female is here with complaint of vaginal itching intermittently along with "hard pops" inside her vagina. Patient reports that she had a hysterectomy in March. She has not followed up with her gynecologist since that time. She denies any vaginal discharge. She states that she has used the oral medication for yeast twice without any relief of her symptoms. She denies any pain.  Past Medical History:  Diagnosis Date  . Anxiety   . Dry skin    stomach rash  . Endometriosis   . History of kidney stones   . History of kidney stones   . Menorrhagia   . Pelvic pain   . Smokers' cough [facility])     Patient Active Problem List   Diagnosis Date Noted  . S/P laparoscopic assisted vaginal hysterectomy (LAVH) 10/30/2016  . Endometriosis 05/15/2012    Class: Present on Admission    Past Surgical History:  Procedure Laterality Date  . EXTRACORPOREAL SHOCK WAVE LITHOTRIPSY  2014 approx.  Marland Kitchen EYE SURGERY Left age 76  . INGUINAL HERNIA REPAIR Bilateral 2006  . LAPAROSCOPIC CHOLECYSTECTOMY  2006  . LAPAROSCOPIC VAGINAL HYSTERECTOMY WITH SALPINGECTOMY Bilateral 10/30/2016   Procedure: LAPAROSCOPIC ASSISTED VAGINAL HYSTERECTOMY WITH SALPINGECTOMY;  Surgeon: Richardean Chimera, MD;  Location: [facility];  Service: Gynecology;  Laterality: Bilateral;  need bed  . LAPAROSCOPY  05/15/2012   Procedure: LAPAROSCOPY OPERATIVE;  Surgeon: Juluis Mire, MD;  Location: [facility];  Service: Gynecology;  Laterality: N/A;  . WISDOM TOOTH EXTRACTION      Prior to Admission medications   Not on File    Allergies Ivp dye [iodinated diagnostic agents] and Zofran  [ondansetron hcl]  No family history on file.  Social History Social History  Substance Use Topics  . Smoking status: Current Every Day Smoker    Packs/day: 2.00    Years: 22.00    Types: Cigarettes  . Smokeless tobacco: Never Used     Comment: since age 17  . Alcohol use Yes     Comment: ocassional    Review of Systems Constitutional: No fever/chills Cardiovascular: Denies chest pain. Respiratory: Denies shortness of breath. Gastrointestinal: No abdominal pain.  No nausea, no vomiting.   Genitourinary: Negative for dysuria.Positive vaginal itching. Skin: Negative for rash.    ____________________________________________   PHYSICAL EXAM:  VITAL SIGNS: ED Triage Vitals  Enc Vitals Group     BP 01/09/17 1319 108/83     Pulse Rate 01/09/17 1319 65     Resp 01/09/17 1319 18     Temp 01/09/17 1319 98.5 F (36.9 C)     Temp Source 01/09/17 1319 Oral     SpO2 01/09/17 1319 100 %     Weight 01/09/17 1320 130 lb (59 kg)     Height 01/09/17 1320 5\' 8"  (1.727 m)     Head Circumference --      Peak Flow --      Pain Score 01/09/17 1331 0     Pain Loc --      Pain Edu? --      Excl. in GC? --     Constitutional: Alert and oriented. Well appearing and in no  acute distress. Eyes: Conjunctivae are normal. PERRL. EOMI. Head: Atraumatic. Neck: No stridor.   Cardiovascular: Normal rate, regular rhythm. Grossly normal heart sounds.  Good peripheral circulation. Respiratory: Normal respiratory effort.  No retractions. Lungs CTAB. Gastrointestinal: Soft and nontender. No distention. Genitourinary: On external exam of the genitalia there is no gross deformity noted. There is no obvious vaginal secretions. Nontender on exam. Wet prep was obtained along with GC and Chlamydia and sent to lab. Patient denies any adnexal tenderness and no masses were noted. Musculoskeletal: No lower extremity tenderness nor edema.  Neurologic:  Normal speech and language. No gross focal neurologic  deficits are appreciated. No gait instability. Skin:  Skin is warm, dry and intact. No rash noted. Psychiatric: Mood and affect are normal. Speech and behavior are normal.  ____________________________________________   LABS (all labs ordered are listed, but only abnormal results are displayed)  Labs Reviewed  URINALYSIS, COMPLETE (UACMP) WITH MICROSCOPIC - Abnormal; Notable for the following:       Result Value   Color, Urine YELLOW (*)    APPearance HAZY (*)    Squamous Epithelial / LPF 6-30 (*)    All other components within normal limits  CHLAMYDIA/NGC RT PCR ([facility] ONLY)  WET PREP, GENITAL     PROCEDURES  Procedure(s) performed: None  Procedures  Critical Care performed: No  ____________________________________________   INITIAL IMPRESSION / ASSESSMENT AND PLAN / ED COURSE  Pertinent labs & imaging results that were available during my care of the patient were reviewed by me and considered in my medical decision making (see chart for details).  Patient was made aware that her wet prep was negative as well as her urinalysis. GC and chlamydia were pending. She is encouraged to follow up with her GYN in [place] for continued symptoms. She will be notified should her gonorrhea and chlamydia tests come back positive which at the time of this dictation is negative for both.    ___________________________________________   FINAL CLINICAL IMPRESSION(S) / ED DIAGNOSES  Final diagnoses:  Vaginal itching      NEW MEDICATIONS STARTED DURING THIS VISIT:  Discharge Medication List as of 01/09/2017  2:46 PM       Note:  This document was prepared using Dragon voice recognition software and may include unintentional dictation errors.    Tommi RumpsSummers, Maryn Freelove L, PA-C 01/09/17 1633    Jeanmarie PlantMcShane, James A, MD 01/10/17 [phone]

## 2017-01-09 NOTE — ED Triage Notes (Signed)
Pt reports hard bumps to inside of vagina that itch intermittently. Pt reports these appeared after hysterectomy in march.

## 2017-01-09 NOTE — Discharge Instructions (Signed)
Call and Make an appointment with your  gynecologist.

## 2017-01-09 NOTE — ED Notes (Signed)
See triage note..the patient c/o "little bumps" to "down there". Pt denies pain, sts that sometimes bumps itch.  Pt reports intercourse w/ stranger 3 months ago, c/o vaginal discharge. Resp even and unlabored, MAE, A/OX4, NAD.

## 2017-07-11 ENCOUNTER — Encounter: Payer: Self-pay | Admitting: Emergency Medicine

## 2017-07-11 ENCOUNTER — Emergency Department
Admission: EM | Admit: 2017-07-11 | Discharge: 2017-07-11 | Discharge status: Against Medical Advice | Payer: Commercial Managed Care - PPO | Attending: Emergency Medicine | Admitting: Emergency Medicine

## 2017-07-11 DIAGNOSIS — R109 Unspecified abdominal pain: Secondary | ICD-10-CM

## 2017-07-11 DIAGNOSIS — Z9049 Acquired absence of other specified parts of digestive tract: Secondary | ICD-10-CM | POA: Insufficient documentation

## 2017-07-11 DIAGNOSIS — F17210 Nicotine dependence, cigarettes, uncomplicated: Secondary | ICD-10-CM | POA: Insufficient documentation

## 2017-07-11 DIAGNOSIS — R1032 Left lower quadrant pain: Secondary | ICD-10-CM | POA: Insufficient documentation

## 2017-07-11 DIAGNOSIS — F419 Anxiety disorder, unspecified: Secondary | ICD-10-CM | POA: Insufficient documentation

## 2017-07-11 NOTE — ED Notes (Signed)
Went into room to draw labs and assess patient. Pt not in room at the time, ultrasound called and pt not with them.  Pt did not inform anyone that she was leaving and the hospital gown was on the bed.    PT marked as eloped.

## 2017-07-11 NOTE — ED Triage Notes (Signed)
Pt to ED via POV with c/o LFT flank pain and dysuria xfew days. Pt denies fever. VS stable. Pt states she also would like to have STD check.

## 2017-07-11 NOTE — ED Provider Notes (Signed)
[facility] Emergency Department Provider Note   ____________________________________________   I have reviewed the triage vital signs and the nursing notes.   HISTORY  Chief Complaint Flank Pain   History limited by: Not Limited   HPI [name] is a 31 y.o. female who presents to the emergency department today because of left flank pain.   LOCATION:left flank DURATION:2-3 days TIMING: intermittent SEVERITY: moderate QUALITY: sharp CONTEXT: patient states she has a history of kidney stones. Required lithotripsy one time in the past.  MODIFYING FACTORS: none ASSOCIATED SYMPTOMS: denies any hematuria. Has developed dysuria. Denies any fever.  Per medical record review patient has a history of kidney stones.  Past Medical History:  Diagnosis Date  . Anxiety   . Dry skin    stomach rash  . Endometriosis   . History of kidney stones   . History of kidney stones   . Menorrhagia   . Pelvic pain   . Smokers' cough [facility](HCC)     Patient Active Problem List   Diagnosis Date Noted  . S/P laparoscopic assisted vaginal hysterectomy (LAVH) 10/30/2016  . Endometriosis 05/15/2012    Class: Present on Admission    Past Surgical History:  Procedure Laterality Date  . EXTRACORPOREAL SHOCK WAVE LITHOTRIPSY  2014 approx.  Marland Kitchen. EYE SURGERY Left age 89  . INGUINAL HERNIA REPAIR Bilateral 2006  . LAPAROSCOPIC CHOLECYSTECTOMY  2006  . LAPAROSCOPIC VAGINAL HYSTERECTOMY WITH SALPINGECTOMY Bilateral 10/30/2016   Procedure: LAPAROSCOPIC ASSISTED VAGINAL HYSTERECTOMY WITH SALPINGECTOMY;  Surgeon: Richardean ChimeraJohn McComb, MD;  [facility];  Service: Gynecology;  Laterality: Bilateral;  need bed  . LAPAROSCOPY  05/15/2012   Procedure: LAPAROSCOPY OPERATIVE;  Surgeon: Juluis MireJohn S McComb, MD;  Location: [facility];  Service: Gynecology;  Laterality: N/A;  . WISDOM TOOTH EXTRACTION      Prior to Admission medications   Not on File    Allergies Ivp dye  [iodinated diagnostic agents] and Zofran [ondansetron hcl]  No family history on file.  Social History Social History   Tobacco Use  . Smoking status: Current Every Day Smoker    Packs/day: 2.00    Years: 22.00    Pack years: 44.00    Types: Cigarettes  . Smokeless tobacco: Never Used  . Tobacco comment: since age 89  Substance Use Topics  . Alcohol use: Yes    Comment: ocassional  . Drug use: No    Review of Systems Constitutional: No fever/chills Eyes: No visual changes. ENT: No sore throat. Cardiovascular: Denies chest pain. Respiratory: Denies shortness of breath. Gastrointestinal: Positive for left flank pain. Genitourinary: Positive for dysuria. Musculoskeletal: Negative for back pain. Skin: Negative for rash. Neurological: Negative for headaches, focal weakness or numbness.  ____________________________________________   PHYSICAL EXAM:  VITAL SIGNS: ED Triage Vitals  Enc Vitals Group     BP 07/11/17 0657 127/83     Pulse Rate 07/11/17 0657 83     Resp 07/11/17 0657 16     Temp 07/11/17 0655 98.3 F (36.8 C)     Temp Source 07/11/17 0655 Oral     SpO2 07/11/17 0657 100 %     Weight 07/11/17 0658 130 lb (59 kg)     Height 07/11/17 0658 5\' 8"  (1.727 m)     Head Circumference --      Peak Flow --      Pain Score 07/11/17 0658 5   Constitutional: Alert and oriented. Well appearing and in no distress. Eyes: Conjunctivae are  normal.  ENT   Head: Normocephalic and atraumatic.   Nose: No congestion/rhinnorhea.   Mouth/Throat: Mucous membranes are moist.   Neck: No stridor. Hematological/Lymphatic/Immunilogical: No cervical lymphadenopathy. Cardiovascular: Normal rate, regular rhythm.  No murmurs, rubs, or gallops. Respiratory: Normal respiratory effort without tachypnea nor retractions. Breath sounds are clear and equal bilaterally. No wheezes/rales/rhonchi. Gastrointestinal: Soft and non tender. Mild left sided CVA tenderness. No rebound. No  guarding.  Genitourinary: Deferred Musculoskeletal: Normal range of motion in all extremities. No lower extremity edema. Neurologic:  Normal speech and language. No gross focal neurologic deficits are appreciated.  Skin:  Skin is warm, dry and intact. No rash noted. Psychiatric: Mood and affect are normal. Speech and behavior are normal. Patient exhibits appropriate insight and judgment.  ____________________________________________    LABS (pertinent positives/negatives)  Pt eloped prior to specimen collection  ____________________________________________   EKG  None  ____________________________________________    RADIOLOGY  US renal - pt eloped [place] to obtaining imaging  ____________________________________________   PROCEDURES  Procedures  ____________________________________________   INITIAL IMPRESSION / ASSESSMENT AND PLAN / ED COURSE  Pertinent labs & imaging results that were available during my care of the patient were reviewed by me and considered in my medical decision making (see chart for details).  Patient presented to the emergency department today because of primary concern for left flank pain. ddx would include kidney stone, pyelonephritis, kidney infarction, shingles amongst other etiologies.  In addition the patient asked for STD check. I discussed with the patient that we do not perform routine or complete STD checks in the emergency department and that she should follow up with health department for testing. Testing was ordered to evaluate for the etiology of the patient's symptoms however the patient did leave prior to any testing being performed. She left without alerting myself or staff so unclear why she left. Was not able to discuss risks of untreated pyelo/kidney stones (those two being my most likely diagnoses) with the patient.   ____________________________________________   FINAL CLINICAL IMPRESSION(S) / ED DIAGNOSES  Final diagnoses:   Left flank pain     Note: This dictation was prepared with Dragon dictation. Any transcriptional errors that result from this process are unintentional     [name] SemenGoodman, Anwar Crill, MD 07/11/17 [phone]

## 2017-08-24 ENCOUNTER — Emergency Department
Admission: EM | Admit: 2017-08-24 | Discharge: 2017-08-24 | Disposition: A | Discharge status: Home / Self Care | Payer: BLUE CROSS/BLUE SHIELD | Attending: Emergency Medicine | Admitting: Emergency Medicine

## 2017-08-24 ENCOUNTER — Other Ambulatory Visit: Payer: Self-pay

## 2017-08-24 ENCOUNTER — Encounter: Payer: Self-pay | Admitting: *Deleted

## 2017-08-24 DIAGNOSIS — S61309A Unspecified open wound of unspecified finger with damage to nail, initial encounter: Secondary | ICD-10-CM | POA: Diagnosis not present

## 2017-08-24 DIAGNOSIS — W228XXA Striking against or struck by other objects, initial encounter: Secondary | ICD-10-CM | POA: Insufficient documentation

## 2017-08-24 DIAGNOSIS — Y939 Activity, unspecified: Secondary | ICD-10-CM | POA: Diagnosis not present

## 2017-08-24 DIAGNOSIS — Y929 Unspecified place or not applicable: Secondary | ICD-10-CM | POA: Diagnosis not present

## 2017-08-24 DIAGNOSIS — F17210 Nicotine dependence, cigarettes, uncomplicated: Secondary | ICD-10-CM | POA: Insufficient documentation

## 2017-08-24 DIAGNOSIS — Y999 Unspecified external cause status: Secondary | ICD-10-CM | POA: Diagnosis not present

## 2017-08-24 DIAGNOSIS — S6991XA Unspecified injury of right wrist, hand and finger(s), initial encounter: Secondary | ICD-10-CM | POA: Diagnosis present

## 2017-08-24 MED ORDER — LIDOCAINE HCL (PF) 1 % IJ SOLN
INTRAMUSCULAR | Status: AC
  Filled 2017-08-24: qty 5

## 2017-08-24 MED ORDER — OXYCODONE-ACETAMINOPHEN 5-325 MG PO TABS
1.0000 | ORAL_TABLET | Freq: Once | ORAL | Status: AC
  Administered 2017-08-24: 1 via ORAL
  Filled 2017-08-24: qty 1

## 2017-08-24 MED ORDER — NEOMYCIN-POLYMYXIN-PRAMOXINE 1 % EX CREA: TOPICAL_CREAM | Freq: Two times a day (BID) | CUTANEOUS | 0 refills | Status: DC

## 2017-08-24 MED ORDER — TRAMADOL HCL 50 MG PO TABS: 50.0000 mg | ORAL_TABLET | Freq: Four times a day (QID) | ORAL | 0 refills | Status: AC | PRN

## 2017-08-24 NOTE — ED Triage Notes (Signed)
PT to ED reporting she hit her right middle finger last night and split her nail. Bleeding under control at this time and only small amount of swelling noted. PT is wearing acrylic nails.

## 2017-08-24 NOTE — Discharge Instructions (Signed)
Finger splint for 7-10 days as needed. °

## 2017-08-24 NOTE — ED Provider Notes (Signed)
[facility] Emergency Department Provider Note    ____________________________________________   First MD Initiated Contact with Patient 08/24/17 1236     (approximate)  I have reviewed the triage vital signs and the nursing notes.   HISTORY  Chief Complaint Finger Injury    HPI [name] is a 32 y.o. female patient presents with a partial avulsed nail of the third digit right hand.  Patient states that occurred when she hit her hand against the railing.  Patient is concerned because of the acrylic nail is causing her to nail to split.  Patient denies loss of sensation or loss of function of the affected digit.  Patient rates pain as a 9/10.  Patient described the pain is "ache".  No pulses measured prior to arrival.   Past Medical History:  Diagnosis Date  . Anxiety   . Dry skin    stomach rash  . Endometriosis   . History of kidney stones   . History of kidney stones   . Menorrhagia   . Pelvic pain   . Smokers' cough [facility](HCC)     Patient Active Problem List   Diagnosis Date Noted  . S/P laparoscopic assisted vaginal hysterectomy (LAVH) 10/30/2016  . Endometriosis 05/15/2012    Class: Present on Admission    Past Surgical History:  Procedure Laterality Date  . EXTRACORPOREAL SHOCK WAVE LITHOTRIPSY  2014 approx.  Marland Kitchen. EYE SURGERY Left age 89  . INGUINAL HERNIA REPAIR Bilateral 2006  . LAPAROSCOPIC CHOLECYSTECTOMY  2006  . LAPAROSCOPIC VAGINAL HYSTERECTOMY WITH SALPINGECTOMY Bilateral 10/30/2016   Procedure: LAPAROSCOPIC ASSISTED VAGINAL HYSTERECTOMY WITH SALPINGECTOMY;  Surgeon: Richardean ChimeraJohn McComb, MD;  [facility];  Service: Gynecology;  Laterality: Bilateral;  need bed  . LAPAROSCOPY  05/15/2012   Procedure: LAPAROSCOPY OPERATIVE;  Surgeon: Juluis MireJohn S McComb, MD;  Location: [facility];  Service: Gynecology;  Laterality: N/A;  . WISDOM TOOTH EXTRACTION      Prior to Admission medications   Medication Sig Start Date End Date  Taking? Authorizing Provider  neomycin-polymyxin-pramoxine (NEOSPORIN PLUS) 1 % cream Apply topically 2 (two) times daily. 08/24/17   Joni ReiningSmith, Matsuko Kretz K, PA-C  traMADol (ULTRAM) 50 MG tablet Take 1 tablet (50 mg total) by mouth every 6 (six) hours as needed. 08/24/17 08/24/18  Joni ReiningSmith, Buford Gayler K, PA-C    Allergies Ivp dye [iodinated diagnostic agents] and Zofran [ondansetron hcl]  History reviewed. No pertinent family history.  Social History Social History   Tobacco Use  . Smoking status: Current Every Day Smoker    Packs/day: 2.00    Years: 22.00    Pack years: 44.00    Types: Cigarettes  . Smokeless tobacco: Never Used  . Tobacco comment: since age 89  Substance Use Topics  . Alcohol use: Yes    Comment: ocassional  . Drug use: No    Review of Systems  Constitutional: No fever/chills Eyes: No visual changes. ENT: No sore throat. Cardiovascular: Denies chest pain. Respiratory: Denies shortness of breath. Gastrointestinal: No abdominal pain.  No nausea, no vomiting.  No diarrhea.  No constipation. Genitourinary: Negative for dysuria. Musculoskeletal: Negative for back pain. Skin: Negative for rash. Neurological: Negative for headaches, focal weakness or numbness. Psychiatric:Anxiety Allergic/Immunilogical: Medication list ____________________________________________   PHYSICAL EXAM:  VITAL SIGNS: ED Triage Vitals  Enc Vitals Group     BP 08/24/17 1142 116/82     Pulse Rate 08/24/17 1142 76     Resp 08/24/17 1142 16  Temp 08/24/17 1142 98.3 F (36.8 C)     Temp Source 08/24/17 1142 Oral     SpO2 08/24/17 1142 100 %     Weight 08/24/17 1143 130 lb (59 kg)     Height 08/24/17 1143 5\' 8"  (1.727 m)     Head Circumference --      Peak Flow --      Pain Score 08/24/17 1142 9     Pain Loc --      Pain Edu? --      Excl. in GC? --    Constitutional: Alert and oriented. Well appearing and in no acute distress. Cardiovascular: Normal rate, regular rhythm. Grossly  normal heart sounds.  Good peripheral circulation. Respiratory: Normal respiratory effort.  No retractions. Lungs CTAB. Neurologic:  Normal speech and language. No gross focal neurologic deficits are appreciated. No gait instability. Skin: Partially avulsed nail third digit left hand.   Psychiatric: Mood and affect are normal. Speech and behavior are normal.  ____________________________________________   LABS (all labs ordered are listed, but only abnormal results are displayed)  Labs Reviewed - No data to display ____________________________________________  EKG   ____________________________________________  RADIOLOGY  No results found.  ____________________________________________   PROCEDURES  Procedure(s) performed: None  .Nail Removal Date/Time: 08/24/2017 2:00 PM Performed by: Joni Reining, PA-C Authorized by: Joni Reining, PA-C   Consent:    Consent obtained:  Verbal   Consent given by:  Patient   Risks discussed:  Infection and permanent nail deformity Location:    Hand:  L long finger Pre-procedure details:    Skin preparation:  Betadine   Preparation: Patient was prepped and draped in the usual sterile fashion   Anesthesia (see MAR for exact dosages):    Anesthesia method:  Nerve block   Block anesthetic:  Lidocaine 1% w/o epi   Block outcome:  Anesthesia achieved Nail Removal:    Nail removed:  Partial   Nail bed repaired: no     Removed nail replaced and anchored: no   Post-procedure details:    Dressing:  Petrolatum-impregnated gauze   Patient tolerance of procedure:  Tolerated well, no immediate complications    Critical Care performed: No  ____________________________________________   INITIAL IMPRESSION / ASSESSMENT AND PLAN / ED COURSE  As part of my medical decision making, I reviewed the following data within the electronic MEDICAL RECORD NUMBER    Patient has incomplete partial nail avulsion of the third digit left hand.   Partial nail removal complete digital block.  Patient given discharge care instructions advised take medication as directed.  Patient advised to follow-up PCP for continued care.      ____________________________________________   FINAL CLINICAL IMPRESSION(S) / ED DIAGNOSES  Final diagnoses:  Fingernail avulsion, partial, initial encounter     ED Discharge Orders        Ordered    neomycin-polymyxin-pramoxine (NEOSPORIN PLUS) 1 % cream  2 times daily     08/24/17 1357    traMADol (ULTRAM) 50 MG tablet  Every 6 hours PRN     08/24/17 1358       Note:  This document was prepared using Dragon voice recognition software and may include unintentional dictation errors.    Joni Reining, PA-C 08/24/17 1418    Dionne Bucy, MD 08/24/17 1556

## 2017-08-24 NOTE — ED Notes (Signed)
Pt discharged to POV with family. Discharge instructions, RX and follow up reviewed. All questions answered.

## 2017-08-24 NOTE — ED Notes (Signed)
See triage note  States she hit her finger on counter last pm  Unsure if she bent back her nail  Dried blood noted around nailbed

## 2017-11-11 ENCOUNTER — Encounter: Payer: Self-pay | Admitting: Emergency Medicine

## 2017-11-11 ENCOUNTER — Other Ambulatory Visit: Payer: Self-pay

## 2017-11-11 ENCOUNTER — Emergency Department
Admission: EM | Admit: 2017-11-11 | Discharge: 2017-11-11 | Disposition: A | Discharge status: Home / Self Care | Payer: BLUE CROSS/BLUE SHIELD | Attending: Emergency Medicine | Admitting: Emergency Medicine

## 2017-11-11 DIAGNOSIS — F17210 Nicotine dependence, cigarettes, uncomplicated: Secondary | ICD-10-CM | POA: Insufficient documentation

## 2017-11-11 DIAGNOSIS — H16012 Central corneal ulcer, left eye: Secondary | ICD-10-CM | POA: Diagnosis present

## 2017-11-11 DIAGNOSIS — H16002 Unspecified corneal ulcer, left eye: Secondary | ICD-10-CM

## 2017-11-11 DIAGNOSIS — H18892 Other specified disorders of cornea, left eye: Secondary | ICD-10-CM

## 2017-11-11 MED ORDER — ERYTHROMYCIN 5 MG/GM OP OINT: TOPICAL_OINTMENT | Freq: Once | OPHTHALMIC | Status: DC

## 2017-11-11 MED ORDER — ERYTHROMYCIN 5 MG/GM OP OINT: 1.0000 "application " | TOPICAL_OINTMENT | Freq: Four times a day (QID) | OPHTHALMIC | 0 refills | Status: DC

## 2017-11-11 MED ORDER — TETRACAINE HCL 0.5 % OP SOLN
1.0000 [drp] | Freq: Once | OPHTHALMIC | Status: AC
  Administered 2017-11-11: 1 [drp] via OPHTHALMIC
  Filled 2017-11-11: qty 2

## 2017-11-11 MED ORDER — FLUORESCEIN SODIUM 1 MG OP STRP
1.0000 | ORAL_STRIP | Freq: Once | OPHTHALMIC | Status: AC
  Administered 2017-11-11: 1 via OPHTHALMIC
  Filled 2017-11-11 (×2): qty 1

## 2017-11-11 MED ORDER — TETRACAINE HCL 0.5 % OP SOLN
OPHTHALMIC | Status: AC
  Filled 2017-11-11: qty 4

## 2017-11-11 MED ORDER — ERYTHROMYCIN 5 MG/GM OP OINT
TOPICAL_OINTMENT | OPHTHALMIC | Status: AC
  Filled 2017-11-11: qty 1

## 2017-11-11 NOTE — Discharge Instructions (Addendum)
Please seek medical attention for any high fevers, chest pain, shortness of breath, change in behavior, persistent vomiting, bloody stool or any other new or concerning symptoms.  

## 2017-11-11 NOTE — ED Triage Notes (Signed)
Left eye sclera red and swelling intermittently since Friday.

## 2017-11-11 NOTE — ED Provider Notes (Signed)
[facility] Emergency Department Provider Note  ____________________________________________   I have reviewed the triage vital signs and the nursing notes.   HISTORY  Chief Complaint Eye Problem   History limited by: Not Limited   HPI [name] is a 32 y.o. female who presents to the emergency department today because of concerns for left eye pain, redness and tearing.  Patient states the symptoms started 2 days ago.  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  She did was not wearing safety goggles and thinks she got something in her eye at that time.  Since that time she has been trying eyedrops but still feels like something is in her eye.  She does describe significant plain redness and bleeding.  At baseline the patient is only sensitive to colors in that eye. She denies any fevers. No nausea or vomiting.  Per medical record review patient has a history of left eye surgery.  Past Medical History:  Diagnosis Date  . Anxiety   . Dry skin    stomach rash  . Endometriosis   . History of kidney stones   . History of kidney stones   . Menorrhagia   . Pelvic pain   . Smokers' cough [facility](HCC)     Patient Active Problem List   Diagnosis Date Noted  . S/P laparoscopic assisted vaginal hysterectomy (LAVH) 10/30/2016  . Endometriosis 05/15/2012    Class: Present on Admission    Past Surgical History:  Procedure Laterality Date  . EXTRACORPOREAL SHOCK WAVE LITHOTRIPSY  2014 approx.  Marland Kitchen. EYE SURGERY Left age 89  . INGUINAL HERNIA REPAIR Bilateral 2006  . LAPAROSCOPIC CHOLECYSTECTOMY  2006  . LAPAROSCOPIC VAGINAL HYSTERECTOMY WITH SALPINGECTOMY Bilateral 10/30/2016   Procedure: LAPAROSCOPIC ASSISTED VAGINAL HYSTERECTOMY WITH SALPINGECTOMY;  Surgeon: Richardean ChimeraJohn McComb, MD;  [facility];  Service: Gynecology;  Laterality: Bilateral;  need bed  . LAPAROSCOPY  05/15/2012    Procedure: LAPAROSCOPY OPERATIVE;  Surgeon: Juluis MireJohn S McComb, MD;  Location: [facility];  Service: Gynecology;  Laterality: N/A;  . WISDOM TOOTH EXTRACTION      Prior to Admission medications   Medication Sig Start Date End Date Taking? Authorizing Provider  neomycin-polymyxin-pramoxine (NEOSPORIN PLUS) 1 % cream Apply topically 2 (two) times daily. 08/24/17   Joni ReiningSmith, Ronald K, PA-C  traMADol (ULTRAM) 50 MG tablet Take 1 tablet (50 mg total) by mouth every 6 (six) hours as needed. 08/24/17 08/24/18  Joni ReiningSmith, Ronald K, PA-C    Allergies Ivp dye [iodinated diagnostic agents] and Zofran [ondansetron hcl]  No family history on file.  Social History Social History   Tobacco Use  . Smoking status: Current Every Day Smoker    Packs/day: 2.00    Years: 22.00    Pack years: 44.00    Types: Cigarettes  . Smokeless tobacco: Never Used  . Tobacco comment: since age 89  Substance Use Topics  . Alcohol use: Yes    Comment: ocassional  . Drug use: No    Review of Systems Constitutional: No fever/chills Eyes: No vision changes. Left eye redness and pain. ENT: No sore throat. Cardiovascular: Denies chest pain. Respiratory: Denies shortness of breath. Gastrointestinal: No abdominal pain.  No nausea, no vomiting.  No diarrhea.   Genitourinary: Negative for dysuria. Musculoskeletal: Negative for back pain. Skin: Negative for rash. Neurological: Negative for headaches, focal weakness or numbness.  ____________________________________________  PHYSICAL EXAM:  VITAL SIGNS: ED Triage Vitals  Enc Vitals Group     BP 11/11/17 1654 126/85     Pulse Rate 11/11/17 1654 87     Resp 11/11/17 1654 16     Temp 11/11/17 1654 98.9 F (37.2 C)     Temp Source 11/11/17 1654 Oral     SpO2 11/11/17 1654 100 %     Weight 11/11/17 1652 130 lb (59 kg)     Height 11/11/17 1652 5\' 8"  (1.727 m)     Head Circumference --      Peak Flow --      Pain Score 11/11/17 1652 9   Constitutional: Alert and oriented.  Well appearing and in no distress. Eyes: Right eye conjunctiva normal. Left eye conjunctiva injected. Under slit lamp and fluorescein staining small rust ring noted to cornea at the 6 o clock position. No obvious FB present.  ENT   Head: Normocephalic and atraumatic.   Nose: No congestion/rhinnorhea.   Mouth/Throat: Mucous membranes are moist.   Neck: No stridor. Hematological/Lymphatic/Immunilogical: No cervical lymphadenopathy. Cardiovascular: Normal rate, regular rhythm.  No murmurs, rubs, or gallops.  Respiratory: Normal respiratory effort without tachypnea nor retractions. Breath sounds are clear and equal bilaterally. No wheezes/rales/rhonchi. Gastrointestinal: Soft and non tender. No rebound. No guarding.  Genitourinary: Deferred Musculoskeletal: Normal range of motion in all extremities. Neurologic:  Normal speech and language. No gross focal neurologic deficits are appreciated.  Skin:  Skin is warm, dry and intact. No rash noted. Psychiatric: Mood and affect are normal. Speech and behavior are normal. Patient exhibits appropriate insight and judgment.  ____________________________________________    LABS (pertinent positives/negatives)  None  ____________________________________________   EKG  None  ____________________________________________    RADIOLOGY  None  ____________________________________________   PROCEDURES  Procedures  ____________________________________________   INITIAL IMPRESSION / ASSESSMENT AND PLAN / ED COURSE  Pertinent labs & imaging results that were available during my care of the patient were reviewed by me and considered in my medical decision making (see chart for details).  Patient presented to the emergency department today because of concerns for left eye pain redness and tearing.  On exam the conjunctivae was injected.  Under slit-lamp and fluorescein staining the patient was found to have a small corneal rust  ring.  No obvious foreign body still present.  I think it is likely patient continues to have a small corneal ulcer in that location.  Will start and prescribe patient on erythromycin eye ointments.  Patient declined prescription for pain medication.  Will give patient ophthalmology follow-up information.  ____________________________________________   FINAL CLINICAL IMPRESSION(S) / ED DIAGNOSES  Final diagnoses:  Ulcer of left cornea  Corneal rust ring of left eye     Note: This dictation was prepared with Dragon dictation. Any transcriptional errors that result from this process are unintentional     [name] Semen, MD 11/11/17 1931

## 2017-11-24 ENCOUNTER — Encounter: Payer: Self-pay | Admitting: Emergency Medicine

## 2017-11-24 ENCOUNTER — Other Ambulatory Visit: Payer: Self-pay

## 2017-11-24 DIAGNOSIS — Z5321 Procedure and treatment not carried out due to patient leaving prior to being seen by health care provider: Secondary | ICD-10-CM | POA: Diagnosis not present

## 2017-11-24 DIAGNOSIS — R42 Dizziness and giddiness: Secondary | ICD-10-CM | POA: Insufficient documentation

## 2017-11-24 LAB — URINALYSIS, COMPLETE (UACMP) WITH MICROSCOPIC
Bilirubin Urine: NEGATIVE
GLUCOSE, UA: NEGATIVE mg/dL
Hgb urine dipstick: NEGATIVE
Ketones, ur: NEGATIVE mg/dL
LEUKOCYTES UA: NEGATIVE
NITRITE: NEGATIVE
PH: 6 (ref 5.0–8.0)
Protein, ur: NEGATIVE mg/dL
SPECIFIC GRAVITY, URINE: 1.018 (ref 1.005–1.030)

## 2017-11-24 LAB — CBC
HCT: 43.4 % (ref 35.0–47.0)
Hemoglobin: 15.1 g/dL (ref 12.0–16.0)
MCH: 32.7 pg (ref 26.0–34.0)
MCHC: 34.8 g/dL (ref 32.0–36.0)
MCV: 94 fL (ref 80.0–100.0)
Platelets: 229 10*3/uL (ref 150–440)
RBC: 4.62 MIL/uL (ref 3.80–5.20)
RDW: 13.9 % (ref 11.5–14.5)
WBC: 7.1 10*3/uL (ref 3.6–11.0)

## 2017-11-24 LAB — BASIC METABOLIC PANEL
ANION GAP: 3 — AB (ref 5–15)
BUN: 18 mg/dL (ref 6–20)
CALCIUM: 9.4 mg/dL (ref 8.9–10.3)
CO2: 28 mmol/L (ref 22–32)
Chloride: 109 mmol/L (ref 101–111)
Creatinine, Ser: 0.9 mg/dL (ref 0.44–1.00)
GFR calc Af Amer: >60 mL/min (ref >60)
GFR calc non Af Amer: >60 mL/min (ref >60)
GLUCOSE: 92 mg/dL (ref 65–99)
Potassium: 4.3 mmol/L (ref 3.5–5.1)
SODIUM: 140 mmol/L (ref 135–145)

## 2017-11-24 NOTE — ED Notes (Addendum)
Charge RN note: Pt to ED via EMS from home c/o dizzy x2 days, VSS, denies LOC.  CBG 106.  Pt to lobby via EMS.

## 2017-11-24 NOTE — ED Triage Notes (Signed)
Patient brought in by ems from home. Patient states that she was sitting on the couch watching tv and became dizzy. Patient states that she still feels dizzy. Patient denies any nausea or vomiting.

## 2017-11-25 ENCOUNTER — Emergency Department
Admission: EM | Admit: 2017-11-25 | Discharge: 2017-11-25 | Disposition: A | Discharge status: Home / Self Care | Payer: BLUE CROSS/BLUE SHIELD | Attending: Emergency Medicine | Admitting: Emergency Medicine

## 2017-11-25 NOTE — ED Notes (Signed)
Patient called for room with no answer.  

## 2017-11-25 NOTE — ED Notes (Signed)
Patient called for a room with no answer. 

## 2017-11-26 ENCOUNTER — Telehealth: Payer: Self-pay | Admitting: Emergency Medicine

## 2017-11-26 NOTE — Telephone Encounter (Signed)
Called patient due to lwot to inquire about condition and follow up plans. Left message.   

## 2017-12-25 ENCOUNTER — Encounter: Payer: Self-pay | Admitting: Emergency Medicine

## 2017-12-25 ENCOUNTER — Emergency Department: Payer: BLUE CROSS/BLUE SHIELD

## 2017-12-25 ENCOUNTER — Emergency Department
Admission: EM | Admit: 2017-12-25 | Discharge: 2017-12-25 | Disposition: A | Discharge status: Home / Self Care | Payer: BLUE CROSS/BLUE SHIELD | Attending: Emergency Medicine | Admitting: Emergency Medicine

## 2017-12-25 ENCOUNTER — Other Ambulatory Visit: Payer: Self-pay

## 2017-12-25 DIAGNOSIS — R42 Dizziness and giddiness: Secondary | ICD-10-CM | POA: Diagnosis not present

## 2017-12-25 DIAGNOSIS — F17210 Nicotine dependence, cigarettes, uncomplicated: Secondary | ICD-10-CM | POA: Diagnosis not present

## 2017-12-25 LAB — CBC
HCT: 44.6 % (ref 35.0–47.0)
Hemoglobin: 15.7 g/dL (ref 12.0–16.0)
MCH: 32.9 pg (ref 26.0–34.0)
MCHC: 35.1 g/dL (ref 32.0–36.0)
MCV: 93.6 fL (ref 80.0–100.0)
PLATELETS: 228 10*3/uL (ref 150–440)
RBC: 4.77 MIL/uL (ref 3.80–5.20)
RDW: 13.8 % (ref 11.5–14.5)
WBC: 6.3 10*3/uL (ref 3.6–11.0)

## 2017-12-25 LAB — URINALYSIS, COMPLETE (UACMP) WITH MICROSCOPIC
Bacteria, UA: NONE SEEN
Bilirubin Urine: NEGATIVE
Glucose, UA: NEGATIVE mg/dL
Hgb urine dipstick: NEGATIVE
Ketones, ur: NEGATIVE mg/dL
LEUKOCYTES UA: NEGATIVE
Nitrite: NEGATIVE
PH: 5 (ref 5.0–8.0)
Protein, ur: NEGATIVE mg/dL
Specific Gravity, Urine: 1.02 (ref 1.005–1.030)

## 2017-12-25 LAB — TROPONIN I: Troponin I: <0.03 ng/mL (ref <0.03)

## 2017-12-25 LAB — BASIC METABOLIC PANEL
Anion gap: 4 — ABNORMAL LOW (ref 5–15)
BUN: 17 mg/dL (ref 6–20)
CALCIUM: 9.1 mg/dL (ref 8.9–10.3)
CHLORIDE: 106 mmol/L (ref 101–111)
CO2: 26 mmol/L (ref 22–32)
CREATININE: 0.79 mg/dL (ref 0.44–1.00)
GFR calc Af Amer: >60 mL/min (ref >60)
GFR calc non Af Amer: >60 mL/min (ref >60)
Glucose, Bld: 104 mg/dL — ABNORMAL HIGH (ref 65–99)
Potassium: 4.7 mmol/L (ref 3.5–5.1)
SODIUM: 136 mmol/L (ref 135–145)

## 2017-12-25 LAB — POCT PREGNANCY, URINE: PREG TEST UR: NEGATIVE

## 2017-12-25 MED ORDER — LORAZEPAM 2 MG/ML IJ SOLN
0.5000 mg | Freq: Once | INTRAMUSCULAR | Status: AC
  Administered 2017-12-25: 0.5 mg via INTRAVENOUS
  Filled 2017-12-25: qty 1

## 2017-12-25 MED ORDER — DIAZEPAM 2 MG PO TABS: 2.0000 mg | ORAL_TABLET | Freq: Three times a day (TID) | ORAL | 0 refills | Status: AC | PRN

## 2017-12-25 MED ORDER — MECLIZINE HCL 25 MG PO TABS
25.0000 mg | ORAL_TABLET | Freq: Once | ORAL | Status: AC
  Administered 2017-12-25: 25 mg via ORAL
  Filled 2017-12-25: qty 1

## 2017-12-25 MED ORDER — PROMETHAZINE HCL 25 MG PO TABS
25.0000 mg | ORAL_TABLET | Freq: Once | ORAL | Status: AC
  Administered 2017-12-25: 25 mg via ORAL
  Filled 2017-12-25: qty 1

## 2017-12-25 MED ORDER — MECLIZINE HCL 25 MG PO TABS: 25.0000 mg | ORAL_TABLET | Freq: Three times a day (TID) | ORAL | 0 refills | Status: DC | PRN

## 2017-12-25 NOTE — ED Triage Notes (Signed)
Patient states she started feeling dizzy yesterday PM and then worse this AM.  "It was worse after I woke up".  Also complaining of nausea.  Denies any other symptoms.

## 2017-12-25 NOTE — ED Notes (Signed)
ESI Level changed from 4 to 3 after consult with Erie Noe in [facility].  Protocols implemented. Patient in [facility] awaiting room.

## 2017-12-25 NOTE — ED Provider Notes (Signed)
[facility] Emergency Department Provider Note   ____________________________________________   First MD Initiated Contact with Patient 12/25/17 [phone]     (approximate)  I have reviewed the triage vital signs and the nursing notes.   HISTORY  Chief Complaint Dizziness    HPI [name] is a 32 y.o. female Who comes in with her mother complaining of lightheadedness and spinning starting yesterday. She had a Long yesterday went home and went to bed woke up this morning was still there. She went to work but had to call her mother to get her from work. She denies having this before but her mother contradicts her and says she has had this before mother says she is anxious and thinks she could be anemic or dehydrated. Patient said she otherwise feels okay and doesn't have any medical problems.she says she has had surgeries. She has had hysterectomy gallbladder surgery and hernia repair.dizziness is worse with movement of her head or body. She has no pain anywhere no fever no swelling no cough or stuffy nose.   Past Medical History:  Diagnosis Date  . Anxiety   . Dry skin    stomach rash  . Endometriosis   . History of kidney stones   . History of kidney stones   . Menorrhagia   . Pelvic pain   . Smokers' cough [facility])     Patient Active Problem List   Diagnosis Date Noted  . S/P laparoscopic assisted vaginal hysterectomy (LAVH) 10/30/2016  . Endometriosis 05/15/2012    Class: Present on Admission    Past Surgical History:  Procedure Laterality Date  . EXTRACORPOREAL SHOCK WAVE LITHOTRIPSY  2014 approx.  Marland Kitchen EYE SURGERY Left age 52  . INGUINAL HERNIA REPAIR Bilateral 2006  . LAPAROSCOPIC CHOLECYSTECTOMY  2006  . LAPAROSCOPIC VAGINAL HYSTERECTOMY WITH SALPINGECTOMY Bilateral 10/30/2016   Procedure: LAPAROSCOPIC ASSISTED VAGINAL HYSTERECTOMY WITH SALPINGECTOMY;  Surgeon: Richardean Chimera, MD;  Location: [facility] [place];  Service: Gynecology;   Laterality: Bilateral;  need bed  . LAPAROSCOPY  05/15/2012   Procedure: LAPAROSCOPY OPERATIVE;  Surgeon: Juluis Mire, MD;  Location: [facility];  Service: Gynecology;  Laterality: N/A;  . WISDOM TOOTH EXTRACTION      Prior to Admission medications   Medication Sig Start Date End Date Taking? Authorizing Provider  diazepam (VALIUM) 2 MG tablet Take 1 tablet (2 mg total) by mouth every 8 (eight) hours as needed (as needed for vertigo). Take1 tab twice a day as needed for vertigo. 12/25/17 12/25/18  Arnaldo Natal, MD  meclizine (ANTIVERT) 25 MG tablet Take 1 tablet (25 mg total) by mouth 3 (three) times daily as needed for dizziness. 12/25/17   Arnaldo Natal, MD  traMADol (ULTRAM) 50 MG tablet Take 1 tablet (50 mg total) by mouth every 6 (six) hours as needed. Patient not taking: Reported on 12/25/2017 08/24/17 08/24/18  Joni Reining, PA-C    Allergies Ivp dye [iodinated diagnostic agents] and Zofran [ondansetron hcl]  No family history on file.  Social History Social History   Tobacco Use  . Smoking status: Current Every Day Smoker    Packs/day: 2.00    Years: 22.00    Pack years: 44.00    Types: Cigarettes  . Smokeless tobacco: Never Used  . Tobacco comment: since age 80  Substance Use Topics  . Alcohol use: Yes    Comment: ocassional  . Drug use: No    Review of Systems  Constitutional: No fever/chills Eyes:  No visual changes. ENT: No sore throat. Cardiovascular: Denies chest pain. Respiratory: Denies shortness of breath. Gastrointestinal: No abdominal pain.  No nausea, no vomiting.  No diarrhea.  No constipation. Genitourinary: Negative for dysuria. Musculoskeletal: Negative for back pain. Skin: Negative for rash. Neurological: Negative for headaches, focal weakness ________________   PHYSICAL EXAM:  VITAL SIGNS: ED Triage Vitals  Enc Vitals Group     BP 12/25/17 0715 117/85     Pulse Rate 12/25/17 0715 74     Resp 12/25/17 0715 18     Temp 12/25/17 0715 98.1  F (36.7 C)     Temp Source 12/25/17 0715 Oral     SpO2 12/25/17 0715 98 %     Weight 12/25/17 0715 134 lb (60.8 kg)     Height 12/25/17 0715  (1.727 m)     Head Circumference --      Peak Flow --      Pain Score 12/25/17 0718 0     Pain Loc --      Pain Edu? --      Excl. in GC? --     Constitutional: Alert and oriented. Well appearing and in no acute distress. Eyes: Conjunctivae are normal. PERRL. EOMI.fundi look normal I do not see any nystagmus Head: Atraumatic. Nose: No congestion/rhinnorhea. Mouth/Throat: Mucous membranes are moist.  Oropharynx non-erythematous. Neck: No stridor.  Cardiovascular: Normal rate, regular rhythm. Grossly normal heart sounds.  Good peripheral circulation. Respiratory: Normal respiratory effort.  No retractions. Lungs CTAB. Gastrointestinal: Soft and nontender. No distention. No abdominal bruits. No CVA tenderness. Musculoskeletal: No lower extremity tenderness nor edema.  No joint effusions. Neurologic:  Normal speech and language. No gross focal neurologic deficits are appreciated. No gait instability. Skin:  Skin is warm, dry and intact. No rash noted. Psychiatric: Mood and affect are normal. Speech and behavior are normal.  ____________________________________________   LABS (all labs ordered are listed, but only abnormal results are displayed)  Labs Reviewed  BASIC METABOLIC PANEL - Abnormal; Notable for the following components:      Result Value   Glucose, Bld 104 (*)    Anion gap 4 (*)    All other components within normal limits  URINALYSIS, COMPLETE (UACMP) WITH MICROSCOPIC - Abnormal; Notable for the following components:   Color, Urine YELLOW (*)    APPearance CLOUDY (*)    All other components within normal limits  CBC  TROPONIN I  CBG MONITORING, ED  POCT PREGNANCY, URINE   ____________________________________________  EKG EKG read and interpreted by me shows normal sinus rhythm rate of 78 normal axis no acute  changes similar to EKG from last month ____________________________________________  RADIOLOGY  ED MD interpretation:  Official radiology report(s): Ct Head Wo Contrast  Result Date: 12/25/2017 CLINICAL DATA:  Dizziness.  Nausea. EXAM: CT HEAD WITHOUT CONTRAST TECHNIQUE: Contiguous axial images were obtained from the base of the skull through the vertex without intravenous contrast. COMPARISON:  None. FINDINGS: Brain: There is no evidence for acute hemorrhage, hydrocephalus, mass lesion, or abnormal extra-axial fluid collection. No definite CT evidence for acute infarction. Vascular: No hyperdense vessel or unexpected calcification. Skull: No evidence for fracture. No worrisome lytic or sclerotic lesion. Sinuses/Orbits: The visualized paranasal sinuses and mastoid air cells are clear. Visualized portions of the globes and intraorbital fat are unremarkable. Other: None. IMPRESSION: Normal exam. Electronically Signed   By: Kennith Center M.D.   On: 12/25/2017 09:07    ____________________________________________   PROCEDURES  Procedure(s) performed: no  nystagmus was elicited with head movement and sitting or lying down or sitting up. Thrust testing did not show any acute deviation did not show any nystagmus either patient though she says she is very nauseated and is not exhibiting any signs of nausea  Procedures  Critical Care performed:  ____________________________________________   INITIAL IMPRESSION / ASSESSMENT AND PLAN / ED COURSE  patient is not orthostatic lab work looks good neuro exam is nonfocal symptoms are consistent with vertigo. On discharge patient is feeling better and is not vertiginous any longer. I will let her go with follow-up with her regular doctor on meclizine in this low dose of Valium. Within normal neurological exam normal CT scan and no past medical history. 32 year old female I doubt there are any serious medical problems that could be causing this.           ____________________________________________   FINAL CLINICAL IMPRESSION(S) / ED DIAGNOSES  Final diagnoses:  Dizziness     ED Discharge Orders        Ordered    meclizine (ANTIVERT) 25 MG tablet  3 times daily PRN     12/25/17 1038    diazepam (VALIUM) 2 MG tablet  Every 8 hours PRN     12/25/17 1038       Note:  This document was prepared using Dragon voice recognition software and may include unintentional dictation errors.    Arnaldo Natal, MD 12/25/17 1039

## 2017-12-25 NOTE — Discharge Instructions (Signed)
Please use the Antivert and Valium. Please follow-up with your regular doctor in the next week unless you are completely well. Please return here if you get worse.

## 2017-12-25 NOTE — ED Notes (Signed)
Pt ambulatory to POV without difficulty. VSS. NAD. Discharge instructions, RX and follow up discussed. All questions addressed.  

## 2017-12-25 NOTE — ED Notes (Signed)
Patient to Room 2 via WC.

## 2019-09-26 IMAGING — CT CT HEAD W/O CM
3 series · 16 of 46 positions shown, 19 images · non-contrast
Comparison: None.

CLINICAL DATA: Dizziness.  Nausea.

EXAM:
CT HEAD WITHOUT CONTRAST
TECHNIQUE: Contiguous axial images were obtained from the base of the skull
through the vertex without intravenous contrast.

[Series 2: head wo · axial · 0.41mm/px · z∈[-120,+0]mm · 10 of 29 slices shown, 13 images]
[im 3/29  brain]
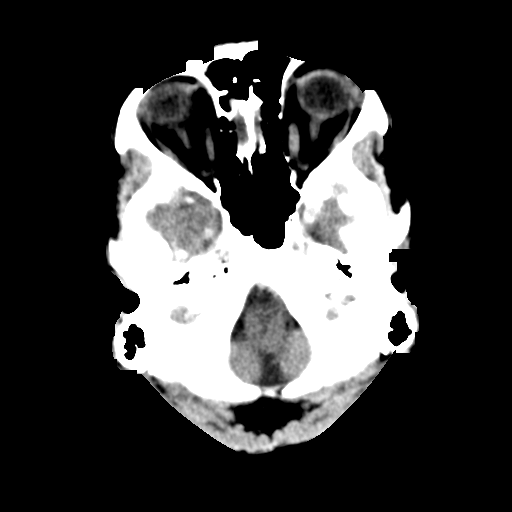
[im 3/29  bone]
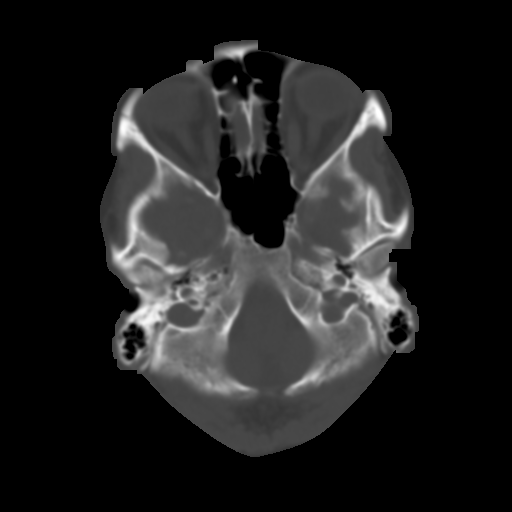
[im 6/29  brain]
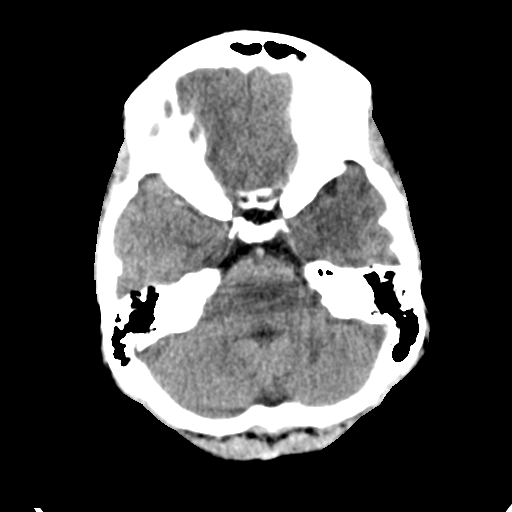
[im 8/29  brain]
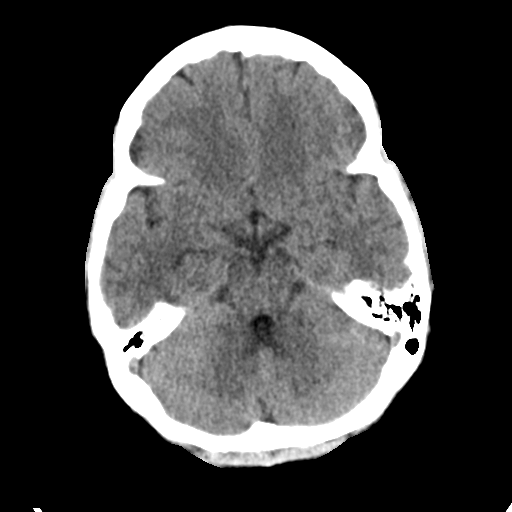
[im 11/29  brain]
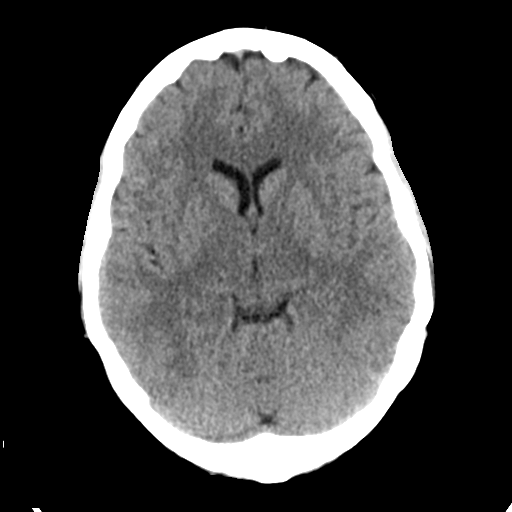
[im 14/29  brain]
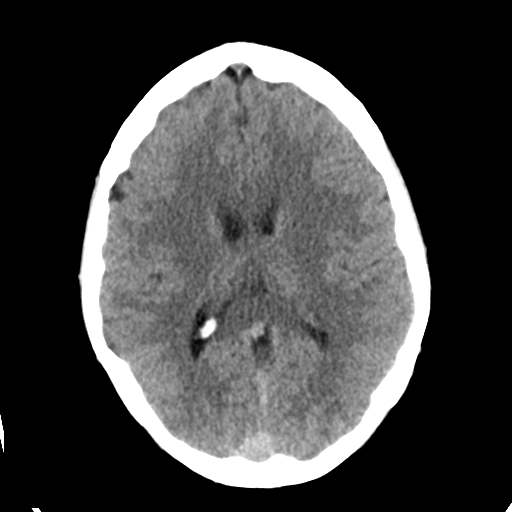
[im 14/29  bone]
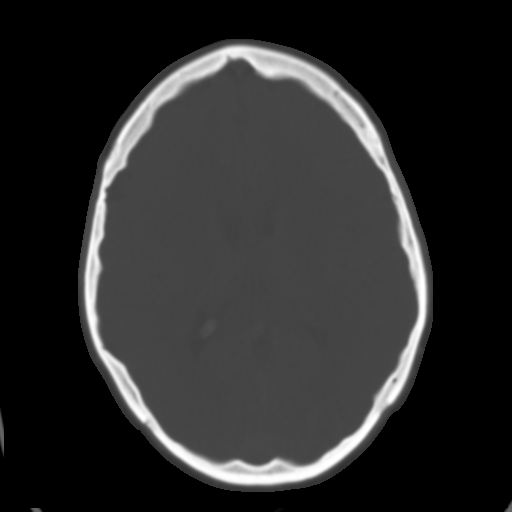
[im 16/29  brain]
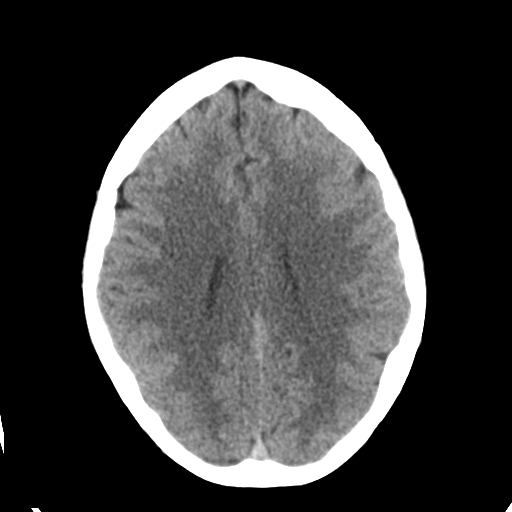
[im 19/29  brain]
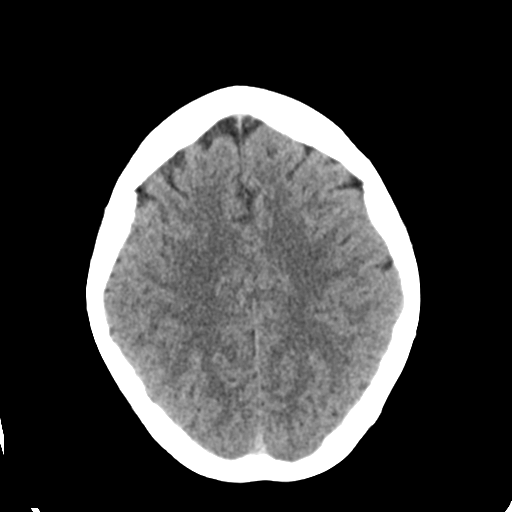
[im 22/29  brain]
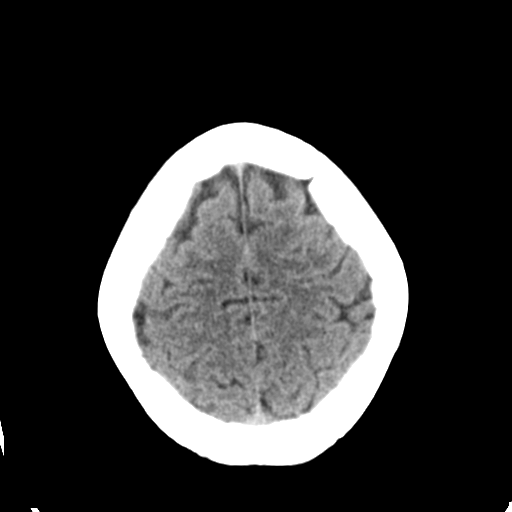
[im 24/29  brain]
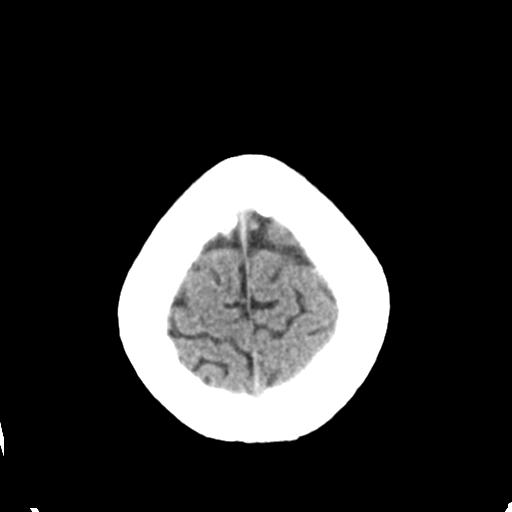
[im 24/29  bone]
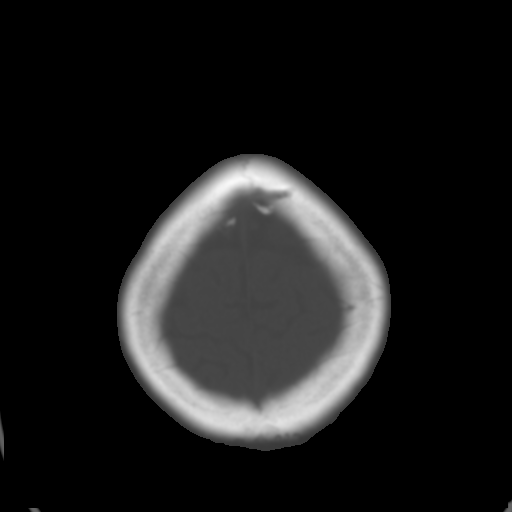
[im 27/29  brain]
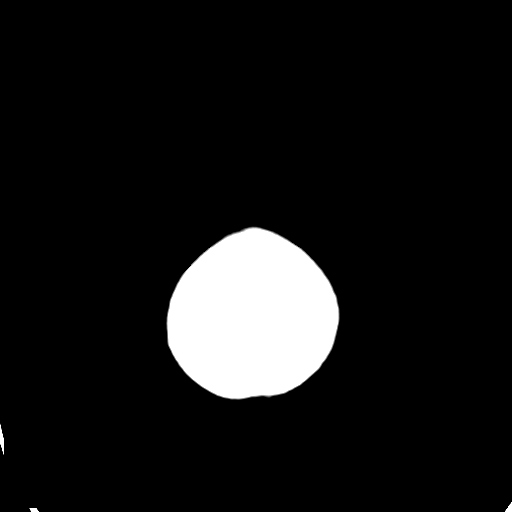

[Series 4: coronal soft tissue · coronal · 0.31mm/px · 3 of 65 slices shown]
[im 22/65  brain]
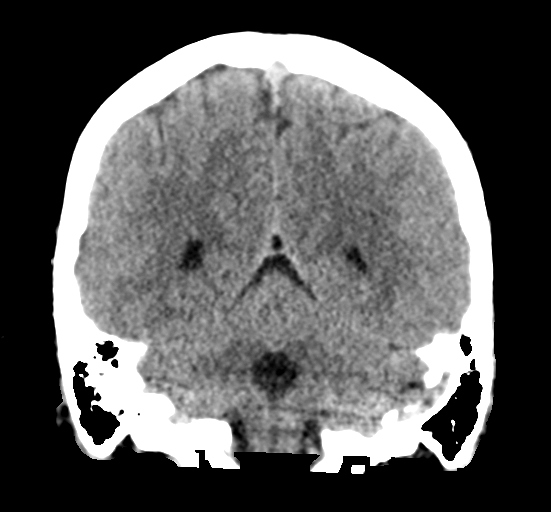
[im 29/65  brain]
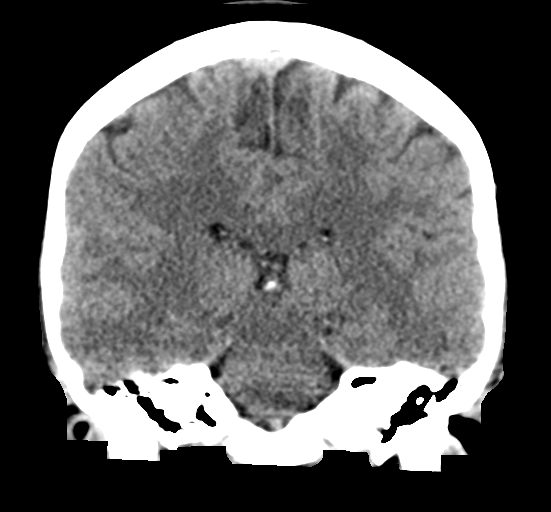
[im 36/65  brain]
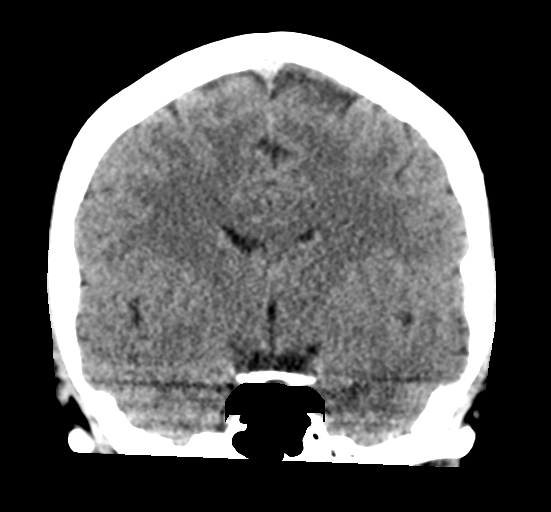

[Series 5: sagittal soft tissue · sagittal · 0.31mm/px · 3 of 57 slices shown]
[im 19/57  brain]
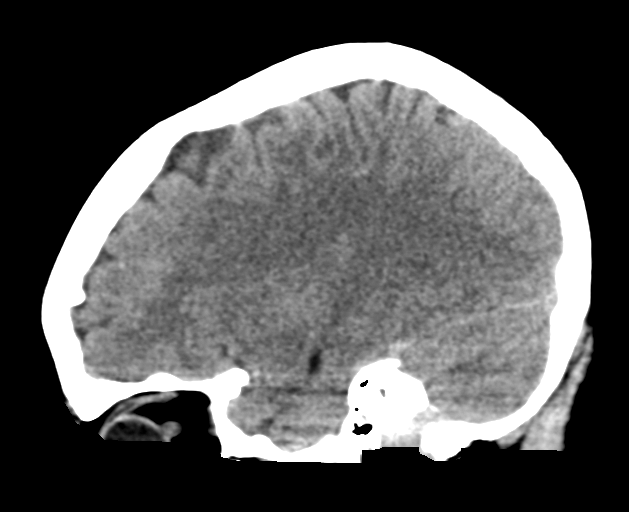
[im 29/57  brain]
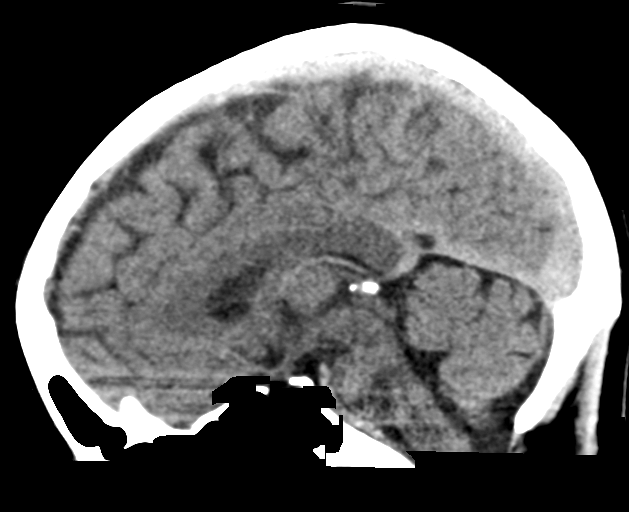
[im 38/57  brain]
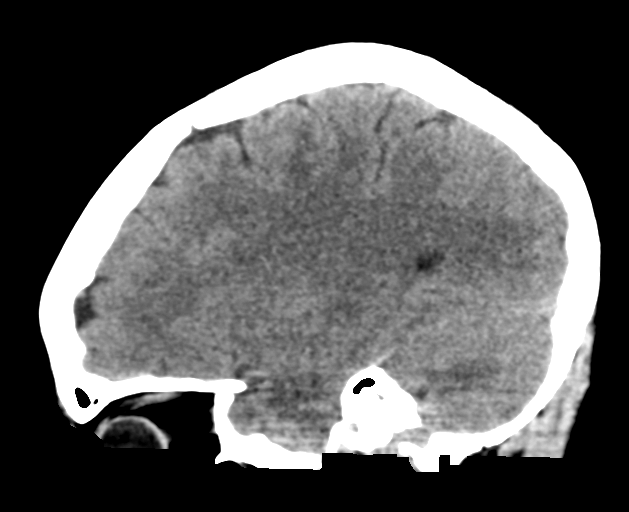

[16 of 46 positions shown; findings below may reference images not displayed]

FINDINGS: Brain: There is no evidence for acute hemorrhage, hydrocephalus,
mass lesion, or abnormal extra-axial fluid collection. No definite
CT evidence for acute infarction.

Vascular: No hyperdense vessel or unexpected calcification.

Skull: No evidence for fracture. No worrisome lytic or sclerotic
lesion.

Sinuses/Orbits: The visualized paranasal sinuses and mastoid air
cells are clear. Visualized portions of the globes and intraorbital
fat are unremarkable.

Other: None.
IMPRESSION: Normal exam.

## 2019-12-31 ENCOUNTER — Ambulatory Visit (INDEPENDENT_AMBULATORY_CARE_PROVIDER_SITE_OTHER): Payer: BC Managed Care – PPO | Admitting: Allergy and Immunology

## 2019-12-31 ENCOUNTER — Encounter: Payer: Self-pay | Admitting: Allergy and Immunology

## 2019-12-31 ENCOUNTER — Other Ambulatory Visit: Payer: Self-pay

## 2019-12-31 VITALS — BP 110/90 | HR 73 | Temp 98.6°F | Resp 18 | Ht 65.75 in | Wt 165.2 lb

## 2019-12-31 DIAGNOSIS — J3089 Other allergic rhinitis: Secondary | ICD-10-CM

## 2019-12-31 DIAGNOSIS — H1010 Acute atopic conjunctivitis, unspecified eye: Secondary | ICD-10-CM | POA: Diagnosis not present

## 2019-12-31 DIAGNOSIS — T781XXA Other adverse food reactions, not elsewhere classified, initial encounter: Secondary | ICD-10-CM

## 2019-12-31 DIAGNOSIS — J301 Allergic rhinitis due to pollen: Secondary | ICD-10-CM | POA: Diagnosis not present

## 2019-12-31 DIAGNOSIS — F17210 Nicotine dependence, cigarettes, uncomplicated: Secondary | ICD-10-CM

## 2019-12-31 MED ORDER — AUVI-Q 0.3 MG/0.3ML IJ SOAJ: INTRAMUSCULAR | 3 refills | Status: AC

## 2019-12-31 MED ORDER — METHYLPREDNISOLONE ACETATE 80 MG/ML IJ SUSP
80.0000 mg | Freq: Once | INTRAMUSCULAR | Status: AC
  Administered 2019-12-31: 80 mg via INTRAMUSCULAR

## 2019-12-31 MED ORDER — OLOPATADINE HCL 0.2 % OP SOLN: OPHTHALMIC | 5 refills | Status: DC

## 2019-12-31 MED ORDER — MONTELUKAST SODIUM 10 MG PO TABS: 10.0000 mg | ORAL_TABLET | Freq: Every day | ORAL | 5 refills | Status: DC

## 2019-12-31 NOTE — Progress Notes (Signed)
[place] - [place] - [place] - [place]   NEW PATIENT NOTE  Referring Provider: No ref. provider found Primary Provider: Marin Comment, FNP Date of office visit: 12/31/2019    Subjective:   Chief Complaint:  [name] (DOB: 10-16-1985) is a 34 y.o. female who presents to the clinic on 12/31/2019 with a chief complaint of Allergic Rhinitis  and Allergy Testing .  HPI: [name] presents to this clinic in evaluation of allergies manifested as nasal congestion and sneezing and itchy red watery eyes occurring on a perennial basis with spring through fall exacerbation especially following exposure to the outdoors and dust.  When she has exposure to dog she actually gets contact urticaria without associated systemic or constitutional symptoms.  She remains symptomatic even though she uses antihistamines and decongestants.  She also has a slight cough.  She does not have shortness of breath or inability to walk or exercise.  She does not have any cold air induced bronchospastic symptoms.  She does not make any sputum production.  She has been smoking since the age of 37 at a rate of 1 to 2 packs/day for stress reduction.  In addition, she will develop intense abdominal pain if she eats egg products.  Even in a product that has baked eggs she will develop this problem and if she eats a scrambled egg she gets intense pain.  A similar problem occurs with banana consumption.  Past Medical History:  Diagnosis Date  . Anxiety   . Dry skin    stomach rash  . Endometriosis   . History of kidney stones   . History of kidney stones   . Menorrhagia   . Pelvic pain   . Smokers' cough [facility])     Past Surgical History:  Procedure Laterality Date  . EXTRACORPOREAL SHOCK WAVE LITHOTRIPSY  2014 approx.  Marland Kitchen EYE SURGERY Left age 67  . INGUINAL HERNIA REPAIR Bilateral 2006  . LAPAROSCOPIC CHOLECYSTECTOMY  2006  . LAPAROSCOPIC VAGINAL HYSTERECTOMY WITH SALPINGECTOMY Bilateral  10/30/2016   Procedure: LAPAROSCOPIC ASSISTED VAGINAL HYSTERECTOMY WITH SALPINGECTOMY;  Surgeon: Richardean Chimera, MD;  Location: [facility];  Service: Gynecology;  Laterality: Bilateral;  need bed  . LAPAROSCOPY  05/15/2012   Procedure: LAPAROSCOPY OPERATIVE;  Surgeon: Juluis Mire, MD;  Location: [facility];  Service: Gynecology;  Laterality: N/A;  . WISDOM TOOTH EXTRACTION      Allergies as of 12/31/2019      Reactions   Ivp Dye [iodinated Diagnostic Agents] Hives, Itching   Zofran [ondansetron Hcl] Nausea And Vomiting      Medication List    [employer] Allergy Relief-D 10-240 MG 24 hr tablet Generic drug: loratadine-pseudoephedrine Take 1 tablet by mouth daily.       Review of systems negative except as noted in HPI / PMHx or noted below:  Review of Systems  Constitutional: Negative.   HENT: Negative.   Eyes: Negative.   Respiratory: Negative.   Cardiovascular: Negative.   Gastrointestinal: Negative.   Genitourinary: Negative.   Musculoskeletal: Negative.   Skin: Negative.   Neurological: Negative.   Endo/Heme/Allergies: Negative.   Psychiatric/Behavioral: Negative.     History reviewed. No pertinent family history.  Social History   Socioeconomic History  . Marital status: Married    Spouse name: Not on file  . Number of children: Not on file  . Years of education: Not on file  . Highest education level: Not on file  Occupational History  .  Not on file  Tobacco Use  . Smoking status: Current Every Day Smoker    Packs/day: 2.00    Years: 22.00    Pack years: 44.00    Types: Cigarettes  . Smokeless tobacco: Never Used  . Tobacco comment: since age 79  Substance and Sexual Activity  . Alcohol use: Yes    Comment: ocassional  . Drug use: No  . Sexual activity: Not on file    Comment: partner - vasectomy  Other Topics Concern  . Not on file  Social History Narrative  . Not on file    Environmental and Social history  Lives in a house with a dry  environment, dogs located inside the household, carpet in the bedroom, plastic on the bed, no plastic on the pillow, actively smoking tobacco products, and employment as a Art gallery manager.  Objective:   Vitals:   12/31/19 0920  BP: 110/90  Pulse: 73  Resp: 18  Temp: 98.6 F (37 C)  SpO2: 98%   Height: 5' 5.75" (167 cm) Weight: 165 lb 3.2 oz (74.9 kg)  Physical Exam Constitutional:      Appearance: She is not diaphoretic.  HENT:     Head: Normocephalic. No right periorbital erythema or left periorbital erythema.     Right Ear: Tympanic membrane, ear canal and external ear normal.     Left Ear: Tympanic membrane, ear canal and external ear normal.     Nose: Mucosal edema present. No rhinorrhea.     Mouth/Throat:     Pharynx: Uvula midline. No oropharyngeal exudate.  Eyes:     General: Lids are normal.     Conjunctiva/sclera: Conjunctivae normal.     Pupils: Pupils are equal, round, and reactive to light.  Neck:     Thyroid: No thyromegaly.     Trachea: Trachea normal. No tracheal tenderness or tracheal deviation.  Cardiovascular:     Rate and Rhythm: Normal rate and regular rhythm.     Heart sounds: Normal heart sounds, S1 normal and S2 normal. No murmur.  Pulmonary:     Effort: Pulmonary effort is normal. No respiratory distress.     Breath sounds: Normal breath sounds. No stridor. No wheezing (Inspiratory and expiratory wheezes throughout all lung fields) or rales.  Chest:     Chest wall: No tenderness.  Abdominal:     General: There is no distension.     Palpations: Abdomen is soft. There is no mass.     Tenderness: There is no abdominal tenderness. There is no guarding or rebound.  Musculoskeletal:        General: No tenderness.  Lymphadenopathy:     Head:     Right side of head: No tonsillar adenopathy.     Left side of head: No tonsillar adenopathy.     Cervical: No cervical adenopathy.  Skin:    Coloration: Skin is not pale.     Findings: No erythema or rash.       Nails: There is no clubbing.  Neurological:     Mental Status: She is alert.     Diagnostics: Allergy skin tests were performed.  She demonstrated hypersensitivity to trees, grasses, weeds, dust mite, cockroach, cat, dog.  Spirometry was performed and demonstrated an FEV1 of 2.73 @ 81 % of predicted. FEV1/FVC = 0.90  The patient had an Asthma Control Test with the following results:  .     Assessment and Plan:    1. Perennial allergic rhinitis   2. Seasonal  allergic rhinitis due to pollen   3. Seasonal allergic conjunctivitis   4. Adverse food reaction, initial encounter   5. Heavy tobacco smoker >10 cigarettes per day     1.  Allergen avoidance measures - dust mite / pollen / cat / dog / cockroach  2.  Use nicotine substitutes to replace tobacco smoke exposure  3.  Treat and prevent inflammation:   A. OTC Nasacort - 1-2 sprays each nostril 1 time per day  B. Montelukast 10 mg - 1 tablet 1 time per day  C. Depomedrol 80 IM delivered in clinic today  4. If needed:   A. OTC antihistamine  B. OTC Pataday - 1 drop each eye 1 time per day  C. Auvi-Q 0.3, benadryl, MD/ER evaluation for allergic reaction  5. Treatment for Asthma?  6. Immunotherapy?  7. Obtain blood - CBC w/d, Banana IgE, Egg panel w/R  8. Return to clinic in 4 weeks or earlier if problem   Lovenia has an atopic immune system minutes giving rise to significant inflammation of her conjunctiva and airway.  Most of her disease process appears to be involving her upper airway but there may also be a component of asthma.  It is difficult to work through her lower airway issue given the fact that she is smoking extensively.  I think that if we can get her to stop smoking we will have a better idea of whether or not asthma is also contributing to some of her objective chest findings.  We will work through her food allergy in more detail as noted above.  I have treated her with a collection of anti-inflammatory  agents for her airway and I will see her back in this clinic in 4 weeks to assess her response to this treatment.  Laurette Schimke, MD Allergy / Immunology [facility]

## 2019-12-31 NOTE — Patient Instructions (Addendum)
  1.  Allergen avoidance measures - dust mite / pollen / cat / dog / cockroach  2.  Use nicotine substitutes to replace tobacco smoke exposure  3.  Treat and prevent inflammation:   A. OTC Nasacort - 1-2 sprays each nostril 1 time per day  B. Montelukast 10 mg - 1 tablet 1 time per day  C. Depomedrol 80 IM delivered in clinic today  4. If needed:   A. OTC antihistamine  B. OTC Pataday - 1 drop each eye 1 time per day  C. Auvi-Q 0.3, benadryl, MD/ER evaluation for allergic reaction  5. Treatment for Asthma?  6. Immunotherapy?  7. Obtain blood - CBC w/d, Banana IgE, Egg panel w/R  8. Return to clinic in 4 weeks or earlier if problem

## 2020-01-01 ENCOUNTER — Encounter: Payer: Self-pay | Admitting: Allergy and Immunology

## 2020-01-02 ENCOUNTER — Encounter: Payer: Self-pay | Admitting: Allergy and Immunology

## 2020-01-27 LAB — CBC WITH DIFFERENTIAL/PLATELET
Basophils Absolute: 0 10*3/uL (ref 0.0–0.2)
Basos: 1 %
EOS (ABSOLUTE): 0.2 10*3/uL (ref 0.0–0.4)
Eos: 3 %
Hematocrit: 44.2 % (ref 34.0–46.6)
Hemoglobin: 15.4 g/dL (ref 11.1–15.9)
Immature Grans (Abs): 0 10*3/uL (ref 0.0–0.1)
Immature Granulocytes: 0 %
Lymphocytes Absolute: 2 10*3/uL (ref 0.7–3.1)
Lymphs: 31 %
MCH: 32.2 pg (ref 26.6–33.0)
MCHC: 34.8 g/dL (ref 31.5–35.7)
MCV: 93 fL (ref 79–97)
Monocytes Absolute: 0.5 10*3/uL (ref 0.1–0.9)
Monocytes: 8 %
Neutrophils Absolute: 3.6 10*3/uL (ref 1.4–7.0)
Neutrophils: 57 %
RBC: 4.78 x10E6/uL (ref 3.77–5.28)
RDW: 12.9 % (ref 11.7–15.4)
WBC: 6.4 10*3/uL (ref 3.4–10.8)

## 2020-01-27 LAB — IGE EGG WHITE W/COMPONENT RFLX: F001-IgE Egg White: 0.19 kU/L — AB

## 2020-01-27 LAB — ALLERGEN BANANA: Allergen Banana IgE: 0.47 kU/L — AB

## 2020-01-28 ENCOUNTER — Ambulatory Visit: Payer: BC Managed Care – PPO | Admitting: Allergy and Immunology

## 2020-07-05 DIAGNOSIS — L853 Xerosis cutis: Secondary | ICD-10-CM | POA: Insufficient documentation

## 2020-07-05 DIAGNOSIS — J410 Simple chronic bronchitis: Secondary | ICD-10-CM | POA: Insufficient documentation

## 2020-07-05 DIAGNOSIS — Z87442 Personal history of urinary calculi: Secondary | ICD-10-CM | POA: Insufficient documentation

## 2020-07-05 DIAGNOSIS — R102 Pelvic and perineal pain: Secondary | ICD-10-CM | POA: Insufficient documentation

## 2020-07-05 DIAGNOSIS — F419 Anxiety disorder, unspecified: Secondary | ICD-10-CM | POA: Insufficient documentation

## 2020-07-05 DIAGNOSIS — N920 Excessive and frequent menstruation with regular cycle: Secondary | ICD-10-CM | POA: Insufficient documentation

## 2020-07-06 ENCOUNTER — Other Ambulatory Visit: Payer: Self-pay

## 2020-07-06 ENCOUNTER — Ambulatory Visit (INDEPENDENT_AMBULATORY_CARE_PROVIDER_SITE_OTHER): Payer: BLUE CROSS/BLUE SHIELD | Admitting: Cardiology

## 2020-07-06 ENCOUNTER — Ambulatory Visit (INDEPENDENT_AMBULATORY_CARE_PROVIDER_SITE_OTHER): Payer: BLUE CROSS/BLUE SHIELD

## 2020-07-06 ENCOUNTER — Encounter: Payer: Self-pay | Admitting: Cardiology

## 2020-07-06 DIAGNOSIS — F17210 Nicotine dependence, cigarettes, uncomplicated: Secondary | ICD-10-CM | POA: Insufficient documentation

## 2020-07-06 DIAGNOSIS — R002 Palpitations: Secondary | ICD-10-CM

## 2020-07-06 HISTORY — DX: Palpitations: R00.2

## 2020-07-06 HISTORY — DX: Nicotine dependence, cigarettes, uncomplicated: F17.210

## 2020-07-06 NOTE — Patient Instructions (Signed)
Medication Instructions:  No medication changes. *If you need a refill on your cardiac medications before your next appointment, please call your pharmacy*   Lab Work: None ordered If you have labs (blood work) drawn today and your tests are completely normal, you will receive your results only by: Marland Kitchen MyChart Message (if you have MyChart) OR . A paper copy in the mail If you have any lab test that is abnormal or we need to change your treatment, we will call you to review the results.   Testing/Procedures: Your physician has requested that you have an echocardiogram. Echocardiography is a painless test that uses sound waves to create images of your heart. It provides your doctor with information about the size and shape of your heart and how well your heart's chambers and valves are working. This procedure takes approximately one hour. There are no restrictions for this procedure.    WHY IS MY DOCTOR PRESCRIBING ZIO? The Zio system is proven and trusted by physicians to detect and diagnose irregular heart rhythms -- and has been prescribed to hundreds of thousands of patients.  The FDA has cleared the Zio system to monitor for many different kinds of irregular heart rhythms. In a study, physicians were able to reach a diagnosis 90% of the time with the Zio system1.  You can wear the Zio monitor -- a small, discreet, comfortable patch -- during your normal day-to-day activity, including while you sleep, shower, and exercise, while it records every single heartbeat for analysis.  1Barrett, P., et al. Comparison of 24 Hour Holter Monitoring Versus 14 Day Novel Adhesive Patch Electrocardiographic Monitoring. American Journal of Medicine, 2014.  ZIO VS. HOLTER MONITORING The Zio monitor can be comfortably worn for up to 14 days. Holter monitors can be worn for 24 to 48 hours, limiting the time to record any irregular heart rhythms you may have. Zio is able to capture data for the 51% of  patients who have their first symptom-triggered arrhythmia after 48 hours.1  LIVE WITHOUT RESTRICTIONS The Zio ambulatory cardiac monitor is a small, unobtrusive, and water-resistant patch--you might even forget you're wearing it. The Zio monitor records and stores every beat of your heart, whether you're sleeping, working out, or showering. Wear the monitor 14 days, remove on 07/20/20.  Follow-Up: At [facility], you and your health needs are our priority.  As part of our continuing mission to provide you with exceptional heart care, we have created designated Provider Care Teams.  These Care Teams include your primary Cardiologist (physician) and Advanced Practice Providers (APPs -  Physician Assistants and Nurse Practitioners) who all work together to provide you with the care you need, when you need it.  We recommend signing up for the patient portal called "MyChart".  Sign up information is provided on this After Visit Summary.  MyChart is used to connect with patients for Virtual Visits (Telemedicine).  Patients are able to view lab/test results, encounter notes, upcoming appointments, etc.  Non-urgent messages can be sent to your provider as well.   To learn more about what you can do with MyChart, go to [email or web address].    Your next appointment:   1 month(s)  The format for your next appointment:   In Person  Provider:   Belva Crome, MD   Other Instructions  Echocardiogram An echocardiogram is a procedure that uses painless sound waves (ultrasound) to produce an image of the heart. Images from an echocardiogram can provide important information about:  Signs of coronary  artery disease (CAD).  Aneurysm detection. An aneurysm is a weak or damaged part of an artery wall that bulges out from the normal force of blood pumping through the body.  Heart size and shape. Changes in the size or shape of the heart can be associated with certain conditions, including heart  failure, aneurysm, and CAD.  Heart muscle function.  Heart valve function.  Signs of a past heart attack.  Fluid buildup around the heart.  Thickening of the heart muscle.  A tumor or infectious growth around the heart valves. Tell a health care provider about:  Any allergies you have.  All medicines you are taking, including vitamins, herbs, eye drops, creams, and over-the-counter medicines.  Any blood disorders you have.  Any surgeries you have had.  Any medical conditions you have.  Whether you are pregnant or may be pregnant. What are the risks? Generally, this is a safe procedure. However, problems may occur, including:  Allergic reaction to dye (contrast) that may be used during the procedure. What happens before the procedure? No specific preparation is needed. You may eat and drink normally. What happens during the procedure?   An IV tube may be inserted into one of your veins.  You may receive contrast through this tube. A contrast is an injection that improves the quality of the pictures from your heart.  A gel will be applied to your chest.  A wand-like tool (transducer) will be moved over your chest. The gel will help to transmit the sound waves from the transducer.  The sound waves will harmlessly bounce off of your heart to allow the heart images to be captured in real-time motion. The images will be recorded on a computer. The procedure may vary among health care providers and hospitals. What happens after the procedure?  You may return to your normal, everyday life, including diet, activities, and medicines, unless your health care provider tells you not to do that. Summary  An echocardiogram is a procedure that uses painless sound waves (ultrasound) to produce an image of the heart.  Images from an echocardiogram can provide important information about the size and shape of your heart, heart muscle function, heart valve function, and fluid buildup  around your heart.  You do not need to do anything to prepare before this procedure. You may eat and drink normally.  After the echocardiogram is completed, you may return to your normal, everyday life, unless your health care provider tells you not to do that. This information is not intended to replace advice given to you by your health care provider. Make sure you discuss any questions you have with your health care provider. Document Revised: 11/14/2018 Document Reviewed: 08/26/2016 Elsevier Patient Education  2020 [employer].

## 2020-07-06 NOTE — Progress Notes (Signed)
Cardiology Office Note:    Date:  07/06/2020   ID:  [name], DOB 10/16/1985, MRN [id number]  PCP:  Marin Comment, FNP  Cardiologist:  Garwin Brothers, MD   Referring MD: Marin Comment, FNP    ASSESSMENT:    1. Palpitations   2. Cigarette smoker    PLAN:    In order of problems listed above:  1. Primary prevention stressed with the patient.  Importance of compliance with diet medication stressed and she vocalized understanding.  I reviewed [facility] hospital records extensively and questions were answered to her satisfaction. 2. Palpitations: These are symptoms which are significant.  I discussed this with her at length.  The coverage regularly and therefore I will do a 2-week active XT ZIO monitoring. 3. She knows to go to the nearest emergency room for any concerning symptoms.  She denies any history of dizziness or passing out spells. 4. Her palpitations probably related to some form of arrhythmia most likely supraventricular tachyarrhythmias.  Evaluation will help assess this.  In the interim I gave her Bystolic 5 mg to be used only on a as needed basis.  She cannot tolerate metoprolol that was given to her by the hospital. 5. Cigarette smoker: I spent 5 minutes with the patient discussing solely about smoking. Smoking cessation was counseled. I suggested to the patient also different medications and pharmacological interventions. Patient is keen to try stopping on its own at this time. He will get back to me if he needs any further assistance in this matter. 6. Patient will be seen in follow-up appointment in 4 weeks or earlier if the patient has any concerns    Medication Adjustments/Labs and Tests Ordered: Current medicines are reviewed at length with the patient today.  Concerns regarding medicines are outlined above.  No orders of the defined types were placed in this encounter.  No orders of the defined types were placed in this encounter.    No chief complaint  on file.    History of Present Illness:    [name] is a 34 y.o. female.  Patient has past medical history of palpitations.  She mentions to me that over the past several weeks she is noticing palpitations a couple of times a week.  No orthopnea or PND.  She walks about half an hour at times without any symptoms.  No chest pain orthopnea or PND.  She is a heavy smoker and smokes up to 2 packs a day.  She mentions to me that she is under significant stress.  Her sister-in-law accompanies her for this visit.  No syncope.  At the time of my evaluation, the patient is alert awake oriented and in no distress.  She went to the emergency room at [facility] and I reviewed reviewed those records.  Past Medical History:  Diagnosis Date  . Anxiety   . Dry skin    stomach rash  . Endometriosis   . History of kidney stones   . History of kidney stones   . Menorrhagia   . Pelvic pain   . S/P laparoscopic assisted vaginal hysterectomy (LAVH) 10/30/2016  . Smokers' cough [facility])     Past Surgical History:  Procedure Laterality Date  . EXTRACORPOREAL SHOCK WAVE LITHOTRIPSY  2014 approx.  Marland Kitchen EYE SURGERY Left age 17  . INGUINAL HERNIA REPAIR Bilateral 2006  . LAPAROSCOPIC CHOLECYSTECTOMY  2006  . LAPAROSCOPIC VAGINAL HYSTERECTOMY WITH SALPINGECTOMY Bilateral 10/30/2016   Procedure: LAPAROSCOPIC ASSISTED VAGINAL HYSTERECTOMY WITH  SALPINGECTOMY;  Surgeon: Richardean Chimera, MD;  Location: [facility];  Service: Gynecology;  Laterality: Bilateral;  need bed  . LAPAROSCOPY  05/15/2012   Procedure: LAPAROSCOPY OPERATIVE;  Surgeon: Juluis Mire, MD;  Location: [facility];  Service: Gynecology;  Laterality: N/A;  . WISDOM TOOTH EXTRACTION      Current Medications: Current Meds  Medication Sig  . AUVI-Q 0.3 MG/0.3ML SOAJ injection Use as directed for life-threatening allergic reaction.  . montelukast (SINGULAIR) 10 MG tablet Take 1 tablet (10 mg total) by mouth at bedtime.  . Olopatadine HCl  0.2 % SOLN Can use one drop in each eye once daily if needed.     Allergies:   Ivp dye [iodinated diagnostic agents] and Zofran [ondansetron hcl]   Social History   Socioeconomic History  . Marital status: Married    Spouse name: Not on file  . Number of children: Not on file  . Years of education: Not on file  . Highest education level: Not on file  Occupational History  . Not on file  Tobacco Use  . Smoking status: Current Every Day Smoker    Packs/day: 2.00    Years: 22.00    Pack years: 44.00    Types: Cigarettes  . Smokeless tobacco: Never Used  . Tobacco comment: since age 48  Substance and Sexual Activity  . Alcohol use: Yes    Comment: ocassional  . Drug use: No  . Sexual activity: Not on file    Comment: partner - vasectomy  Other Topics Concern  . Not on file  Social History Narrative  . Not on file   Social Determinants of Health   Financial Resource Strain:   . Difficulty of Paying Living Expenses: Not on file  Food Insecurity:   . Worried About Programme researcher, broadcasting/film/video in the Last Year: Not on file  . Ran Out of Food in the Last Year: Not on file  Transportation Needs:   . Lack of Transportation (Medical): Not on file  . Lack of Transportation (Non-Medical): Not on file  Physical Activity:   . Days of Exercise per Week: Not on file  . Minutes of Exercise per Session: Not on file  Stress:   . Feeling of Stress : Not on file  Social Connections:   . Frequency of Communication with Friends and Family: Not on file  . Frequency of Social Gatherings with Friends and Family: Not on file  . Attends Religious Services: Not on file  . Active Member of Clubs or Organizations: Not on file  . Attends Banker Meetings: Not on file  . Marital Status: Not on file     Family History: The patient's family history includes Diabetes in her maternal grandfather, maternal grandmother, paternal grandfather, and paternal grandmother; Heart disease in her  maternal grandfather, maternal grandmother, paternal grandfather, and paternal grandmother; Hypertension in her maternal grandfather, maternal grandmother, paternal grandfather, and paternal grandmother.  ROS:   Please see the history of present illness.    All other systems reviewed and are negative.  EKGs/Labs/Other Studies Reviewed:    The following studies were reviewed today: EKG reveals sinus rhythm and nonspecific ST-T changes   Recent Labs: 01/22/2020: Hemoglobin 15.4; Platelets CANCELED  Recent Lipid Panel No results found for: CHOL, TRIG, HDL, CHOLHDL, VLDL, LDLCALC, LDLDIRECT  Physical Exam:    VS:  BP 112/74   Pulse 75   Ht 5\' 8"  (1.727 m)   Wt 154 lb 3.2 oz (  69.9 kg)   LMP 10/25/2016 (Exact Date)   SpO2 99%   BMI 23.45 kg/m     Wt Readings from Last 3 Encounters:  07/06/20 154 lb 3.2 oz (69.9 kg)  12/31/19 165 lb 3.2 oz (74.9 kg)  12/25/17 134 lb (60.8 kg)     GEN: Patient is in no acute distress HEENT: Normal NECK: No JVD; No carotid bruits LYMPHATICS: No lymphadenopathy CARDIAC: Hear sounds regular, 2/6 systolic murmur at the apex. RESPIRATORY:  Clear to auscultation without rales, wheezing or rhonchi  ABDOMEN: Soft, non-tender, non-distended MUSCULOSKELETAL:  No edema; No deformity  SKIN: Warm and dry NEUROLOGIC:  Alert and oriented x 3 PSYCHIATRIC:  Normal affect   Signed, Garwin Brothers, MD  07/06/2020 2:03 PM    [facility]

## 2020-07-07 LAB — TSH: TSH: 1.64 u[IU]/mL (ref 0.450–4.500)

## 2020-08-02 ENCOUNTER — Ambulatory Visit (INDEPENDENT_AMBULATORY_CARE_PROVIDER_SITE_OTHER): Payer: BLUE CROSS/BLUE SHIELD

## 2020-08-02 ENCOUNTER — Other Ambulatory Visit: Payer: Self-pay

## 2020-08-02 DIAGNOSIS — R002 Palpitations: Secondary | ICD-10-CM

## 2020-08-02 LAB — ECHOCARDIOGRAM COMPLETE
Area-P 1/2: 4.6 cm2
S' Lateral: 2.9 cm

## 2020-08-02 NOTE — Progress Notes (Signed)
Complete echocardiogram performed.  Jimmy Lateesha Bezold RDCS, RVT  

## 2020-08-12 ENCOUNTER — Encounter: Payer: Self-pay | Admitting: Cardiology

## 2020-08-12 ENCOUNTER — Other Ambulatory Visit: Payer: Self-pay

## 2020-08-12 ENCOUNTER — Ambulatory Visit (INDEPENDENT_AMBULATORY_CARE_PROVIDER_SITE_OTHER): Payer: Medicaid Other | Admitting: Cardiology

## 2020-08-12 VITALS — BP 106/72 | HR 74 | Ht 68.0 in | Wt 153.6 lb

## 2020-08-12 DIAGNOSIS — F17210 Nicotine dependence, cigarettes, uncomplicated: Secondary | ICD-10-CM

## 2020-08-12 DIAGNOSIS — R002 Palpitations: Secondary | ICD-10-CM | POA: Diagnosis not present

## 2020-08-12 NOTE — Progress Notes (Signed)
Cardiology Office Note:    Date:  08/12/2020   ID:  [name], DOB Oct 08, 1985, MRN [id number]  PCP:  Marin Comment, FNP  Cardiologist:  Garwin Brothers, MD   Referring MD: Marin Comment, FNP    ASSESSMENT:    No diagnosis found. PLAN:    In order of problems listed above:  1. I reassured the patient about my findings and primary prevention stressed.  Importance of compliance with diet medication stressed and she vocalized understanding. 2. Palpitations: Stable at this time.  She will use beta-blockers on a as needed basis.  Overall her blood pressure is borderline.  She was advised to exercise on a regular basis.  Echocardiogram and monitoring results are detailed below and they were within normal limits. 3. Cigarette smoker: I spent 5 minutes with the patient discussing solely about smoking. Smoking cessation was counseled. I suggested to the patient also different medications and pharmacological interventions. Patient is keen to try stopping on its own at this time. He will get back to me if he needs any further assistance in this matter. 4. Patient will be seen in follow-up appointment in 6 months or earlier if the patient has any concerns    Medication Adjustments/Labs and Tests Ordered: Current medicines are reviewed at length with the patient today.  Concerns regarding medicines are outlined above.  No orders of the defined types were placed in this encounter.  No orders of the defined types were placed in this encounter.    No chief complaint on file.    History of Present Illness:    [name] is a 35 y.o. female.  Patient has past medical history of palpitations and also unfortunately continues to smoke.  She denies any problems at this time and takes care of activities of daily living.  No chest pain orthopnea or PND.  At the time of my evaluation, the patient is alert awake oriented and in no distress.  Her palpitations still occur and she uses Bystolic at  times with this she has some relief.  No syncope.  At the time of my evaluation, the patient is alert awake oriented and in no distress.  Past Medical History:  Diagnosis Date  . Anxiety   . Cigarette smoker 07/06/2020  . Dry skin    stomach rash  . Endometriosis   . History of kidney stones   . History of kidney stones   . Menorrhagia   . Palpitations 07/06/2020  . Pelvic pain   . S/P laparoscopic assisted vaginal hysterectomy (LAVH) 10/30/2016  . Smokers' cough [facility])     Past Surgical History:  Procedure Laterality Date  . EXTRACORPOREAL SHOCK WAVE LITHOTRIPSY  2014 approx.  Marland Kitchen EYE SURGERY Left age 54  . INGUINAL HERNIA REPAIR Bilateral 2006  . LAPAROSCOPIC CHOLECYSTECTOMY  2006  . LAPAROSCOPIC VAGINAL HYSTERECTOMY WITH SALPINGECTOMY Bilateral 10/30/2016   Procedure: LAPAROSCOPIC ASSISTED VAGINAL HYSTERECTOMY WITH SALPINGECTOMY;  Surgeon: Richardean Chimera, MD;  Location: [facility];  Service: Gynecology;  Laterality: Bilateral;  need bed  . LAPAROSCOPY  05/15/2012   Procedure: LAPAROSCOPY OPERATIVE;  Surgeon: Juluis Mire, MD;  Location: [facility];  Service: Gynecology;  Laterality: N/A;  . WISDOM TOOTH EXTRACTION      Current Medications: Current Meds  Medication Sig  . AUVI-Q 0.3 MG/0.3ML SOAJ injection Use as directed for life-threatening allergic reaction.  . montelukast (SINGULAIR) 10 MG tablet Take 1 tablet (10 mg total) by mouth at bedtime.  . Olopatadine HCl  0.2 % SOLN Can use one drop in each eye once daily if needed.     Allergies:   Ivp dye [iodinated diagnostic agents] and Zofran [ondansetron hcl]   Social History   Socioeconomic History  . Marital status: Married    Spouse name: Not on file  . Number of children: Not on file  . Years of education: Not on file  . Highest education level: Not on file  Occupational History  . Not on file  Tobacco Use  . Smoking status: Current Every Day Smoker    Packs/day: 2.00    Years: 22.00    Pack years:  44.00    Types: Cigarettes  . Smokeless tobacco: Never Used  . Tobacco comment: since age 6  Substance and Sexual Activity  . Alcohol use: Yes    Comment: ocassional  . Drug use: No  . Sexual activity: Not on file    Comment: partner - vasectomy  Other Topics Concern  . Not on file  Social History Narrative  . Not on file   Social Determinants of Health   Financial Resource Strain: Not on file  Food Insecurity: Not on file  Transportation Needs: Not on file  Physical Activity: Not on file  Stress: Not on file  Social Connections: Not on file     Family History: The patient's family history includes Diabetes in her maternal grandfather, maternal grandmother, paternal grandfather, and paternal grandmother; Heart disease in her maternal grandfather, maternal grandmother, paternal grandfather, and paternal grandmother; Hypertension in her maternal grandfather, maternal grandmother, paternal grandfather, and paternal grandmother.  ROS:   Please see the history of present illness.    All other systems reviewed and are negative.  EKGs/Labs/Other Studies Reviewed:    The following studies were reviewed today: IMPRESSIONS    1. Left ventricular ejection fraction, by estimation, is 60 to 65%. The  left ventricle has normal function. The left ventricle has no regional  wall motion abnormalities. Left ventricular diastolic parameters were  normal.  2. Trivial aortic regurgitation.  EVENT MONITOR REPORT:   Patient was monitored from 07/06/2020 to 07/19/2020. Indication:                    Palpitations Ordering physician:  Jenean Lindau, MD  Referring physician:        Jenean Lindau, MD    Baseline rhythm: Sinus  Minimum heart rate: 32 BPM.  Average heart rate: 76 BPM.  Maximal heart rate 158 BPM.  Atrial arrhythmia: None significant  Ventricular arrhythmia: PVCs seen and occasional ventricular bigeminy and trigeminy  Conduction abnormality: None  significant.  Symptoms: None significant   Conclusion:  Mildly abnormal but largely unremarkable event monitor.  Interpreting  cardiologist: Jenean Lindau, MD  Date: 07/27/2020 1:24 PM    Recent Labs: 01/22/2020: Hemoglobin 15.4; Platelets CANCELED 07/06/2020: TSH 1.640  Recent Lipid Panel No results found for: CHOL, TRIG, HDL, CHOLHDL, VLDL, LDLCALC, LDLDIRECT  Physical Exam:    VS:  BP 106/72   Pulse 74   Ht 5\' 8"  (1.727 m)   Wt 153 lb 9.6 oz (69.7 kg)   LMP 10/25/2016 (Exact Date)   SpO2 97%   BMI 23.35 kg/m     Wt Readings from Last 3 Encounters:  08/12/20 153 lb 9.6 oz (69.7 kg)  07/06/20 154 lb 3.2 oz (69.9 kg)  12/31/19 165 lb 3.2 oz (74.9 kg)     GEN: Patient is in no acute distress HEENT: Normal NECK:  No JVD; No carotid bruits LYMPHATICS: No lymphadenopathy CARDIAC: Hear sounds regular, 2/6 systolic murmur at the apex. RESPIRATORY:  Clear to auscultation without rales, wheezing or rhonchi  ABDOMEN: Soft, non-tender, non-distended MUSCULOSKELETAL:  No edema; No deformity  SKIN: Warm and dry NEUROLOGIC:  Alert and oriented x 3 PSYCHIATRIC:  Normal affect   Signed, Garwin Brothers, MD  08/12/2020 1:44 PM    [facility]

## 2020-08-12 NOTE — Patient Instructions (Signed)

## 2021-07-14 ENCOUNTER — Encounter: Payer: Self-pay | Admitting: Allergy and Immunology

## 2021-07-14 ENCOUNTER — Other Ambulatory Visit: Payer: Self-pay

## 2021-07-14 ENCOUNTER — Ambulatory Visit (INDEPENDENT_AMBULATORY_CARE_PROVIDER_SITE_OTHER): Payer: Medicaid Other | Admitting: Allergy and Immunology

## 2021-07-14 VITALS — BP 110/78 | HR 68 | Resp 18 | Ht 65.8 in | Wt 153.2 lb

## 2021-07-14 DIAGNOSIS — J3089 Other allergic rhinitis: Secondary | ICD-10-CM

## 2021-07-14 DIAGNOSIS — H1013 Acute atopic conjunctivitis, bilateral: Secondary | ICD-10-CM | POA: Diagnosis not present

## 2021-07-14 DIAGNOSIS — F17210 Nicotine dependence, cigarettes, uncomplicated: Secondary | ICD-10-CM

## 2021-07-14 DIAGNOSIS — J301 Allergic rhinitis due to pollen: Secondary | ICD-10-CM | POA: Diagnosis not present

## 2021-07-14 DIAGNOSIS — T781XXA Other adverse food reactions, not elsewhere classified, initial encounter: Secondary | ICD-10-CM

## 2021-07-14 DIAGNOSIS — H1010 Acute atopic conjunctivitis, unspecified eye: Secondary | ICD-10-CM

## 2021-07-14 MED ORDER — OLOPATADINE HCL 0.2 % OP SOLN: OPHTHALMIC | 11 refills | Status: AC

## 2021-07-14 MED ORDER — MONTELUKAST SODIUM 10 MG PO TABS: 10.0000 mg | ORAL_TABLET | Freq: Every day | ORAL | 11 refills | Status: DC

## 2021-07-14 NOTE — Progress Notes (Signed)
[place] - [place] - [place] - [place] - [place]   Follow-up Note  Referring Provider: Marin Comment, FNP Primary Provider: Marin Comment, FNP Date of Office Visit: 07/14/2021  Subjective:   [name] (DOB: July 06, 1986) is a 35 y.o. female who [facility] in re-evaluation of the following:  HPI: [name] returns to this clinic in evaluation of allergic rhinoconjunctivitis, history of tobacco smoking, and a history of adverse reaction directed against egg and banana.  I last saw her in this clinic during her initial evaluation of 31 Dec 2019.  Ever since she started using montelukast she has had excellent control of her atopic disease and very little problems with her nose or her throat or her eyes and can go through each season of the year doing very well.  Occasionally during the spring she will use some Claritin and some Nasacort.  She remains away from consumption of egg and banana.  She still continues to smoke.  She has had 2 Pfizer COVID vaccines and has received this year's flu vaccine.  Allergies as of 07/14/2021       Reactions   Ivp Dye [iodinated Diagnostic Agents] Hives, Itching   Zofran [ondansetron Hcl] Nausea And Vomiting        Medication List    Auvi-Q 0.3 mg/0.3 mL Soaj injection Generic drug: EPINEPHrine Use as directed for life-threatening allergic reaction.   montelukast 10 MG tablet Commonly known as: SINGULAIR Take 1 tablet (10 mg total) by mouth at bedtime.   Olopatadine HCl 0.2 % Soln Can use one drop in each eye once daily if needed.    Past Medical History:  Diagnosis Date   Anxiety    Cigarette smoker 07/06/2020   Dry skin    stomach rash   Endometriosis    History of kidney stones    History of kidney stones    Menorrhagia    Palpitations 07/06/2020   Pelvic pain    S/P laparoscopic assisted vaginal hysterectomy (LAVH) 10/30/2016   Smokers' cough [facility])     Past Surgical  History:  Procedure Laterality Date   EXTRACORPOREAL SHOCK WAVE LITHOTRIPSY  2014 approx.   EYE SURGERY Left age 4   INGUINAL HERNIA REPAIR Bilateral 2006   LAPAROSCOPIC CHOLECYSTECTOMY  2006   LAPAROSCOPIC VAGINAL HYSTERECTOMY WITH SALPINGECTOMY Bilateral 10/30/2016   Procedure: LAPAROSCOPIC ASSISTED VAGINAL HYSTERECTOMY WITH SALPINGECTOMY;  Surgeon: Richardean Chimera, MD;  Location: [facility];  Service: Gynecology;  Laterality: Bilateral;  need bed   LAPAROSCOPY  05/15/2012   Procedure: LAPAROSCOPY OPERATIVE;  Surgeon: Juluis Mire, MD;  Location: [facility];  Service: Gynecology;  Laterality: N/A;   WISDOM TOOTH EXTRACTION      Review of systems negative except as noted in HPI / PMHx or noted below:  Review of Systems  Constitutional: Negative.   HENT: Negative.    Eyes: Negative.   Respiratory: Negative.    Cardiovascular: Negative.   Gastrointestinal: Negative.   Genitourinary: Negative.   Musculoskeletal: Negative.   Skin: Negative.   Neurological: Negative.   Endo/Heme/Allergies: Negative.   Psychiatric/Behavioral: Negative.      Objective:   Vitals:   07/14/21 1117  BP: 110/78  Pulse: 68  Resp: 18  SpO2: 99%   Height: 5' 5.8" (167.1 cm)    Physical Exam Constitutional:      Appearance: She is not diaphoretic.  HENT:     Head: Normocephalic.     Right Ear: Tympanic  membrane, ear canal and external ear normal.     Left Ear: Tympanic membrane, ear canal and external ear normal.     Nose: Nose normal. No mucosal edema or rhinorrhea.     Mouth/Throat:     Pharynx: Uvula midline. No oropharyngeal exudate.  Eyes:     Conjunctiva/sclera: Conjunctivae normal.  Neck:     Thyroid: No thyromegaly.     Trachea: Trachea normal. No tracheal tenderness or tracheal deviation.  Cardiovascular:     Rate and Rhythm: Normal rate and regular rhythm.     Heart sounds: Normal heart sounds, S1 normal and S2 normal. No murmur heard. Pulmonary:     Effort: No  respiratory distress.     Breath sounds: Normal breath sounds. No stridor. No wheezing or rales.  Lymphadenopathy:     Head:     Right side of head: No tonsillar adenopathy.     Left side of head: No tonsillar adenopathy.     Cervical: No cervical adenopathy.  Skin:    Findings: No erythema or rash.     Nails: There is no clubbing.  Neurological:     Mental Status: She is alert.    Diagnostics:   Results of blood tests obtained 22 January 2020 identified IgE antibodies directed against banana at 0.47 KU/L, egg at 0.19 KU/L, WBC 6.4, absolute eosinophil 200, absolute lymphocyte 2000, hemoglobin 15.4.  Assessment and Plan:   1. Perennial allergic rhinitis   2. Seasonal allergic rhinitis due to pollen   3. Seasonal allergic conjunctivitis   4. Adverse food reaction, initial encounter   5. Heavy tobacco smoker >10 cigarettes per day     1.  Allergen avoidance measures - dust mite / pollen / cat / dog / cockroach  2.  Use nicotine substitutes to replace tobacco smoke exposure  3.  Treat and prevent inflammation:   A. Montelukast 10 mg - 1 tablet 1 time per day  4. If needed:   A. OTC antihistamine  B. OTC Pataday - 1 drop each eye 1 time per day  C. Auvi-Q 0.3, benadryl, MD/ER evaluation for allergic reaction  D. OTC Nasacort - 1-2 sprays each nostril 1 time per day  5. Return to clinic in 1 year or earlier if needed.   [name] appears to be doing very well while using montelukast as her sole controller agent to manage her atopic disease and I have refilled this medication for the next year.  I have informed her that she can get a refill from her primary care doctor in the future as there is no reason to come and see [place] just to receive a refill for montelukast and an epinephrine injection device.  She is certainly welcome to return here should she have significant problems in the face of the plan noted above.  Once again I did have a talk with her today about her tobacco  hobby.  Laurette Schimke, MD Allergy / Immunology [facility]

## 2021-07-14 NOTE — Patient Instructions (Addendum)
  1.  Allergen avoidance measures - dust mite / pollen / cat / dog / cockroach  2.  Use nicotine substitutes to replace tobacco smoke exposure  3.  Treat and prevent inflammation:   A. Montelukast 10 mg - 1 tablet 1 time per day  4. If needed:   A. OTC antihistamine  B. OTC Pataday - 1 drop each eye 1 time per day  C. Auvi-Q 0.3, benadryl, MD/ER evaluation for allergic reaction  D. OTC Nasacort - 1-2 sprays each nostril 1 time per day  5. Return to clinic in 1 year or earlier if needed.

## 2021-07-18 ENCOUNTER — Encounter: Payer: Self-pay | Admitting: Allergy and Immunology

## 2023-04-04 ENCOUNTER — Other Ambulatory Visit: Payer: Self-pay | Admitting: Allergy and Immunology

## 2023-09-03 ENCOUNTER — Ambulatory Visit (INDEPENDENT_AMBULATORY_CARE_PROVIDER_SITE_OTHER): Payer: BLUE CROSS/BLUE SHIELD | Admitting: Allergy and Immunology

## 2023-09-03 ENCOUNTER — Encounter: Payer: Self-pay | Admitting: Allergy and Immunology

## 2023-09-03 VITALS — BP 112/74 | HR 84 | Resp 18 | Ht 65.8 in | Wt 167.6 lb

## 2023-09-03 DIAGNOSIS — H1013 Acute atopic conjunctivitis, bilateral: Secondary | ICD-10-CM

## 2023-09-03 DIAGNOSIS — F17210 Nicotine dependence, cigarettes, uncomplicated: Secondary | ICD-10-CM

## 2023-09-03 DIAGNOSIS — J3089 Other allergic rhinitis: Secondary | ICD-10-CM

## 2023-09-03 DIAGNOSIS — J301 Allergic rhinitis due to pollen: Secondary | ICD-10-CM

## 2023-09-03 DIAGNOSIS — H1010 Acute atopic conjunctivitis, unspecified eye: Secondary | ICD-10-CM

## 2023-09-03 DIAGNOSIS — T781XXD Other adverse food reactions, not elsewhere classified, subsequent encounter: Secondary | ICD-10-CM

## 2023-09-03 MED ORDER — MONTELUKAST SODIUM 10 MG PO TABS: 10.0000 mg | ORAL_TABLET | Freq: Every day | ORAL | 3 refills | Status: AC

## 2023-09-03 NOTE — Progress Notes (Unsigned)
[place] - [place] - [place] - [place] -    Follow-up Note  Referring Provider: Marin Comment, FNP Primary Provider: Marin Comment, FNP Date of Office Visit: 09/03/2023  Subjective:   [name] (DOB: 03/02/1986) is a 38 y.o. female who [facility] 09[facility]23 in re-evaluation of the following:  HPI: Kensli returns to this clinic in evaluation of allergic rhinoconjunctivitis, history of tobacco smoking, history of adverse reaction directed against egg and banana.  I last saw her in this clinic 14 July 2021.  She has really done well over the course of the past 2 years even as she goes through the seasonal pollination season while consistently using montelukast and Nasacort and occasionally some antihistamines and occasionally some Pataday.  She has not had any significant airway issues and or overall she is very satisfied with the response she has received on her current therapy.  She does not consume adequate banana.  She continues to smoke approximately 1/2 pack of cigarettes per day.  Allergies as of 09/03/2023       Reactions   Ivp Dye [iodinated Contrast Media] Hives, Itching   Zofran [ondansetron Hcl] Nausea And Vomiting        Medication List    Auvi-Q 0.3 MG/0.3ML Soaj injection Generic drug: EPINEPHrine Use as directed for life-threatening allergic reaction.   montelukast 10 MG tablet Commonly known as: SINGULAIR Take 1 tablet (10 mg total) by mouth at bedtime.   Nasacort Allergy 24HR 55 MCG/ACT Aero nasal inhaler Generic drug: triamcinolone Place 2 sprays into the nose daily as needed.   Olopatadine HCl 0.2 % Soln Can use one drop in each eye once daily if needed.    Past Medical History:  Diagnosis Date   Anxiety    Cigarette smoker 07/06/2020   Dry skin    stomach rash   Endometriosis    History of kidney stones    History of kidney stones    Menorrhagia    Palpitations 07/06/2020   Pelvic  pain    S/P laparoscopic assisted vaginal hysterectomy (LAVH) 10/30/2016   Smokers' cough [facility])     Past Surgical History:  Procedure Laterality Date   EXTRACORPOREAL SHOCK WAVE LITHOTRIPSY  2014 approx.   EYE SURGERY Left age 81   INGUINAL HERNIA REPAIR Bilateral 2006   LAPAROSCOPIC CHOLECYSTECTOMY  2006   LAPAROSCOPIC VAGINAL HYSTERECTOMY WITH SALPINGECTOMY Bilateral 10/30/2016   Procedure: LAPAROSCOPIC ASSISTED VAGINAL HYSTERECTOMY WITH SALPINGECTOMY;  Surgeon: Richardean Chimera, MD;  Location: [facility] [place];  Service: Gynecology;  Laterality: Bilateral;  need bed   LAPAROSCOPY  05/15/2012   Procedure: LAPAROSCOPY OPERATIVE;  Surgeon: Juluis Mire, MD;  Location: [facility];  Service: Gynecology;  Laterality: N/A;   WISDOM TOOTH EXTRACTION      Review of systems negative except as noted in HPI / PMHx or noted below:  Review of Systems  Constitutional: Negative.   HENT: Negative.    Eyes: Negative.   Respiratory: Negative.    Cardiovascular: Negative.   Gastrointestinal: Negative.   Genitourinary: Negative.   Musculoskeletal: Negative.   Skin: Negative.   Neurological: Negative.   Endo/Heme/Allergies: Negative.   Psychiatric/Behavioral: Negative.       Objective:   Vitals:   09/03/23 1502  BP: 112/74  Pulse: 84  Resp: 18  SpO2: 98%   Height: 5' 5.8" (167.1 cm)  Weight: 167 lb 9.6 oz (76 kg)   Physical Exam Constitutional:      Appearance:  She is not diaphoretic.  HENT:     Head: Normocephalic.     Right Ear: Tympanic membrane, ear canal and external ear normal.     Left Ear: Tympanic membrane, ear canal and external ear normal.     Nose: Nose normal. No mucosal edema or rhinorrhea.     Mouth/Throat:     Pharynx: Uvula midline. No oropharyngeal exudate.  Eyes:     Conjunctiva/sclera: Conjunctivae normal.  Neck:     Thyroid: No thyromegaly.     Trachea: Trachea normal. No tracheal tenderness or tracheal deviation.  Cardiovascular:     Rate and Rhythm:  Normal rate and regular rhythm.     Heart sounds: Normal heart sounds, S1 normal and S2 normal. No murmur heard. Pulmonary:     Effort: No respiratory distress.     Breath sounds: Normal breath sounds. No stridor. No wheezing or rales.  Lymphadenopathy:     Head:     Right side of head: No tonsillar adenopathy.     Left side of head: No tonsillar adenopathy.     Cervical: No cervical adenopathy.  Skin:    Findings: No erythema or rash.     Nails: There is no clubbing.  Neurological:     Mental Status: She is alert.     Diagnostics: none   Assessment and Plan:   1. Perennial allergic rhinitis   2. Seasonal allergic rhinitis due to pollen   3. Seasonal allergic conjunctivitis   4. Adverse food reaction, subsequent encounter   5. Tobacco smoker, less than 10 cigarettes per day    1.  Continue to treat and prevent inflammation:   A.  Montelukast 10 mg - 1 tablet 1 time per day  B.  Nasacort-1-2 sprays each nostril 1 time per day  2.  If needed:   A.  Auvi-Q 0.3, Benadryl, MD/ER evaluation for allergic reaction  B.  OTC antihistamine  C.  OTC Pataday  3.  Obtain flu vaccine  4.  Influenza = Tamiflu.  COVID = Paxlovid.  5.  Return to clinic in 1 year or earlier if problem  Durwin Nora will continue on montelukast and Nasacort which is working very well for her over the course of the past several years and she has a selection of agents that she can add in should they be required as noted above.  Assuming she does well with this plan I will see her back in this clinic in 1 year or earlier if there is a problem.  Laurette Schimke, MD Allergy / Immunology [facility]

## 2023-09-03 NOTE — Patient Instructions (Addendum)
  1.  Continue to treat and prevent inflammation:   A.  Montelukast 10 mg - 1 tablet 1 time per day  B.  Nasacort-1-2 sprays each nostril 1 time per day  2.  If needed:   A.  Auvi-Q 0.3, Benadryl, MD/ER evaluation for allergic reaction  B.  OTC antihistamine  C.  OTC Pataday  3.  Obtain flu vaccine  4.  Influenza = Tamiflu.  COVID = Paxlovid.  5.  Return to clinic in 1 year or earlier if problem

## 2023-09-04 ENCOUNTER — Encounter: Payer: Self-pay | Admitting: Allergy and Immunology

## 2023-11-27 ENCOUNTER — Other Ambulatory Visit: Payer: Self-pay

## 2023-11-27 ENCOUNTER — Emergency Department
Admission: EM | Admit: 2023-11-27 | Discharge: 2023-11-27 | Disposition: A | Discharge status: Home / Self Care | Attending: Emergency Medicine | Admitting: Emergency Medicine

## 2023-11-27 ENCOUNTER — Emergency Department

## 2023-11-27 DIAGNOSIS — R059 Cough, unspecified: Secondary | ICD-10-CM | POA: Diagnosis present

## 2023-11-27 DIAGNOSIS — J4521 Mild intermittent asthma with (acute) exacerbation: Secondary | ICD-10-CM

## 2023-11-27 LAB — RESP PANEL BY RT-PCR (RSV, FLU A&B, COVID)  RVPGX2
Influenza A by PCR: NEGATIVE
Influenza B by PCR: NEGATIVE
Resp Syncytial Virus by PCR: NEGATIVE
SARS Coronavirus 2 by RT PCR: NEGATIVE

## 2023-11-27 MED ORDER — PREDNISONE 50 MG PO TABS: 50.0000 mg | ORAL_TABLET | Freq: Every day | ORAL | 0 refills | Status: AC

## 2023-11-27 MED ORDER — IPRATROPIUM-ALBUTEROL 0.5-2.5 (3) MG/3ML IN SOLN
3.0000 mL | Freq: Once | RESPIRATORY_TRACT | Status: AC
  Administered 2023-11-27: 3 mL via RESPIRATORY_TRACT
  Filled 2023-11-27: qty 3

## 2023-11-27 MED ORDER — ALBUTEROL SULFATE HFA 108 (90 BASE) MCG/ACT IN AERS: 2.0000 | INHALATION_SPRAY | Freq: Four times a day (QID) | RESPIRATORY_TRACT | 2 refills | Status: AC | PRN

## 2023-11-27 NOTE — ED Triage Notes (Signed)
 Pt reports she has "chronic pneumonia" and feels like she is having a flare up. Pt reports cough and congestion

## 2023-11-27 NOTE — ED Provider Notes (Cosign Needed Addendum)
 [facility] Provider Note    Event Date/Time   First MD Initiated Contact with Patient 11/27/23 1921     (approximate)   History   Cough    HPI  [name] is a 38 y.o. female     with a past medical history of allergies, pneumonia, presents to the ED complaining of cough, a adynamia, anorexia, tightness of her chest in the last 24 hours. Patient denies history  of hypertension or other medical conditions. Patient states having history of multiple pneumonias in the past and she feels is having pneumonia now due to her cough and congestion. Mother has history of asthma.       Physical Exam   Triage Vital Signs: ED Triage Vitals  Encounter Vitals Group     BP 11/27/23 1908 (!) 138/91     Systolic BP Percentile --      Diastolic BP Percentile --      Pulse Rate 11/27/23 1908 73     Resp 11/27/23 1908 18     Temp 11/27/23 1908 98.1 F (36.7 C)     Temp Source 11/27/23 1908 Oral     SpO2 11/27/23 1908 100 %     Weight 11/27/23 1906 165 lb (74.8 kg)     Height 11/27/23 1906 5\' 8"  (1.727 m)     Head Circumference --      Peak Flow --      Pain Score 11/27/23 1906 9     Pain Loc --      Pain Education --      Exclude from Growth Chart --     Most recent vital signs: Vitals:   11/27/23 1908  BP: (!) 138/91  Pulse: 73  Resp: 18  Temp: 98.1 F (36.7 C)  SpO2: 100%     Constitutional: Alert, NAD. Able to speak in complete sentences without cough or dyspnea. Patient is afebrile, sats 100%, no tachycardic  or tachypnea.  Eyes: Conjunctivae are normal.  Head: Atraumatic. Nose: No congestion/rhinnorhea. Mouth/Throat: Mucous membranes are moist.   Neck: Painless ROM. Supple. No JVD, nodes, thyromegaly  Cardiovascular:   Good peripheral circulation.RRR no murmurs, gallops, rubs  Respiratory: Normal respiratory effort.  Left lung: Expiratory wheezing.  Gastrointestinal: Soft and nontender.  Musculoskeletal:  no deformity Neurologic:  MAE  spontaneously. No gross focal neurologic deficits are appreciated.  Skin:  Skin is warm, dry and intact. No rash noted. Psychiatric: Mood and affect are normal. Speech and behavior are normal.    ED Results / Procedures / Treatments   Labs (all labs ordered are listed, but only abnormal results are displayed) Labs Reviewed  RESP PANEL BY RT-PCR (RSV, FLU A&B, COVID)  [id number]     EKG     RADIOLOGY I independently reviewed and interpreted imaging and agree with radiologists findings.      PROCEDURES:  Critical Care performed:   Procedures   MEDICATIONS ORDERED IN ED: Medications  ipratropium-albuterol  (DUONEB) 0.5-2.5 (3) MG/3ML nebulizer solution 3 mL (3 mLs Nebulization Given 11/27/23 1938)   Clinical Course as of 11/27/23 2027  Tue Nov 27, 2023  1928 DG Chest 2 View [AE]  1935 DG Chest 2 View No acute cardiopulmonary disease. [AE]  2022 Reassessed the patient after DuoNeb, no expiratory wheezing in the left lung.  Patient will be discharged with albuterol  and prednisone . [AE]    Clinical Course User Index [AE] Awilda Lennox, PA-C    IMPRESSION / MDM / ASSESSMENT AND PLAN /  ED COURSE  I reviewed the triage vital signs and the nursing notes.  Differential diagnosis includes, but is not limited to, asthma, pneumonia, viral upper respiratory infection, influenza  Patient's presentation is most consistent with acute complicated illness / injury requiring diagnostic workup.   Patient's diagnosis is consistent with asthma exacerbation. I independently reviewed and interpreted imaging and agree with radiologists findings pneumonia. Labs are  reassuring ruling out COVID, influenza, RSV. I did review the patient's allergies and medications.The patient is in stable and satisfactory condition for discharge home. During admission patient had breathing treatment with DuoNeb with resolution  of the wheezing.  Patient will be discharged home with prescriptions for prednisone ,  albuterol . Patient is to follow up with PCP as needed or otherwise directed. Patient is given ED precautions to return to the ED for any worsening or new symptoms. Discussed plan of care with patient, answered all of patient's questions, Patient agreeable to plan of care. Advised patient to take medications according to the instructions on the label. Discussed possible side effects of new medications. Patient verbalized understanding.    FINAL CLINICAL IMPRESSION(S) / ED DIAGNOSES   Final diagnoses:  Mild intermittent asthma with exacerbation     Rx / DC Orders   ED Discharge Orders          Ordered    albuterol  (VENTOLIN  HFA) 108 (90 Base) MCG/ACT inhaler  Every 6 hours PRN        11/27/23 2026    predniSONE  (DELTASONE ) 50 MG tablet  Daily with breakfast        11/27/23 2026             Note:  This document was prepared using Dragon voice recognition software and may include unintentional dictation errors.   Awilda Lennox, PA-C 11/27/23 2027    Awilda Lennox, PA-C 11/28/23 2320    Lubertha Rush, MD 11/29/23 Emelie Hanger

## 2023-11-27 NOTE — ED Notes (Signed)
 Pt is COAx4, breathing normally, and normal in color. Pt is sitting upright fowlers in the bed in a tri-pod position, however in no apparent respiratory distress. Pt is speaking in complete sentences. PT is complaining of a cough that will not go away. Pt's family sitting at bedside with her.

## 2023-11-27 NOTE — Discharge Instructions (Addendum)
 You have been diagnosed with asthma exacerbation, please use albuterol  2 puffs every 6 hours as needed for wheezing or shortness of breath.  Please take prednisone  1 tablet by mouth daily with breakfast for 3 days.  Drink plenty of fluids.  Come back to ED or go to your PCP if you have new symptoms or symptoms worsen.

## 2023-12-03 ENCOUNTER — Emergency Department

## 2023-12-03 ENCOUNTER — Other Ambulatory Visit: Payer: Self-pay

## 2023-12-03 ENCOUNTER — Emergency Department
Admission: EM | Admit: 2023-12-03 | Discharge: 2023-12-03 | Disposition: A | Discharge status: Home / Self Care | Attending: Emergency Medicine | Admitting: Emergency Medicine

## 2023-12-03 DIAGNOSIS — J069 Acute upper respiratory infection, unspecified: Secondary | ICD-10-CM | POA: Insufficient documentation

## 2023-12-03 DIAGNOSIS — R0602 Shortness of breath: Secondary | ICD-10-CM | POA: Diagnosis present

## 2023-12-03 LAB — BASIC METABOLIC PANEL WITH GFR
Anion gap: 7 (ref 5–15)
BUN: 11 mg/dL (ref 6–20)
CO2: 24 mmol/L (ref 22–32)
Calcium: 9.1 mg/dL (ref 8.9–10.3)
Chloride: 105 mmol/L (ref 98–111)
Creatinine, Ser: 0.93 mg/dL (ref 0.44–1.00)
GFR, Estimated: >60 mL/min (ref >60)
Glucose, Bld: 96 mg/dL (ref 70–99)
Potassium: 4.1 mmol/L (ref 3.5–5.1)
Sodium: 136 mmol/L (ref 135–145)

## 2023-12-03 LAB — CBC
HCT: 45.8 % (ref 36.0–46.0)
Hemoglobin: 16 g/dL — ABNORMAL HIGH (ref 12.0–15.0)
MCH: 32.8 pg (ref 26.0–34.0)
MCHC: 34.9 g/dL (ref 30.0–36.0)
MCV: 93.9 fL (ref 80.0–100.0)
Platelets: 294 10*3/uL (ref 150–400)
RBC: 4.88 MIL/uL (ref 3.87–5.11)
RDW: 13.4 % (ref 11.5–15.5)
WBC: 7.4 10*3/uL (ref 4.0–10.5)
nRBC: 0 % (ref 0.0–0.2)

## 2023-12-03 MED ORDER — GUAIFENESIN-CODEINE 100-10 MG/5ML PO SOLN: 5.0000 mL | Freq: Three times a day (TID) | ORAL | 0 refills | Status: AC | PRN

## 2023-12-03 MED ORDER — IPRATROPIUM-ALBUTEROL 0.5-2.5 (3) MG/3ML IN SOLN
3.0000 mL | Freq: Once | RESPIRATORY_TRACT | Status: AC
  Administered 2023-12-03: 3 mL via RESPIRATORY_TRACT
  Filled 2023-12-03: qty 3

## 2023-12-03 NOTE — ED Triage Notes (Addendum)
 Pt to ED via POV from home. Pt reports chronic PNA and seen her on 4/22 for SOB. Pt reports continued cough and congestion and worsening SOB. NO relief with inhaler or steroid. Xray and respiratory panel negative on 4/22. Pt told to come back if symptoms worsened

## 2023-12-03 NOTE — ED Provider Notes (Signed)
 [facility] Provider Note    Event Date/Time   First MD Initiated Contact with Patient 12/03/23 1146     (approximate)   History   Shortness of Breath   HPI  [name] is a 38 year old female presenting to the emergency department for evaluation of cough and shortness of breath.  Patient was seen in our ER on 11/27/2023 for similar.  Had a DuoNeb treatment with some improvement, but reports ongoing symptoms despite using the albuterol  inhaler and steroids which she was discharged with.  No noted fevers.  Notes worsened cough at night when she is trying to lay down.  No history of asthma.     Physical Exam   Triage Vital Signs: ED Triage Vitals [12/03/23 1104]  Encounter Vitals Group     BP (!) 131/91     Systolic BP Percentile      Diastolic BP Percentile      Pulse Rate 75     Resp 20     Temp 98.1 F (36.7 C)     Temp Source Oral     SpO2 100 %     Weight      Height      Head Circumference      Peak Flow      Pain Score 0     Pain Loc      Pain Education      Exclude from Growth Chart     Most recent vital signs: Vitals:   12/03/23 1104  BP: (!) 131/91  Pulse: 75  Resp: 20  Temp: 98.1 F (36.7 C)  SpO2: 100%     General: Awake, interactive  CV:  Regular rate, good peripheral perfusion.  Resp:  Unlabored respirations, somewhat tight lung sounds with occasional wheeze Abd:  Nondistended Neuro:  Symmetric facial movement, fluid speech   ED Results / Procedures / Treatments   Labs (all labs ordered are listed, but only abnormal results are displayed) Labs Reviewed  CBC - Abnormal; Notable for the following components:      Result Value   Hemoglobin 16.0 (*)    All other components within normal limits  BASIC METABOLIC PANEL WITH GFR     EKG EKG independently reviewed interpreted by myself (ER attending) demonstrates:  EKG demonstrates sinus rhythm rate of 73, PR 134, QRS 76, QTc 412, nonspecific ST changes, no  STEMI  RADIOLOGY Imaging independently reviewed and interpreted by myself demonstrates:  CXR without focal consolidation  Formal Radiology Read:  DG Chest 2 View Result Date: 12/03/2023 CLINICAL DATA:  Chronic pneumonia. Continued cough and congestion with worsening shortness of breath. EXAM: CHEST - 2 VIEW COMPARISON:  11/27/2023 FINDINGS: Cardiomediastinal silhouette and pulmonary vasculature are within normal limits. Lungs are clear. IMPRESSION: No acute cardiopulmonary process. Electronically Signed   By: Elester Grim M.D.   On: 12/03/2023 11:45    PROCEDURES:  Critical Care performed: No  Procedures   MEDICATIONS ORDERED IN ED: Medications  ipratropium-albuterol  (DUONEB) 0.5-2.5 (3) MG/3ML nebulizer solution 3 mL (3 mLs Nebulization Given 12/03/23 1217)     IMPRESSION / MDM / ASSESSMENT AND PLAN / ED COURSE  I reviewed the triage vital signs and the nursing notes.  Differential diagnosis includes, but is not limited to, viral illness, pneumonia, bronchitis, low risk PE and PERC negative  Patient's presentation is most consistent with acute complicated illness / injury requiring diagnostic workup.  38 year old female presenting with ongoing cough and shortness of breath.  Labs  sent from triage overall reassuring.  X-Ivry Pigue remains without evidence of pneumonia.  Did trial DuoNeb treatment here.  Patient did have some improvement following this with improvement in her lung sounds.  She is comfortable with discharge home.  Primary concern is ongoing cough despite over-the-counter medications.  Will DC with prescription for cough syrup, instructed not to drive, operate machinery when taking this.  Strict return precautions provided.  Patient discharged stable condition.      FINAL CLINICAL IMPRESSION(S) / ED DIAGNOSES   Final diagnoses:  Upper respiratory tract infection, unspecified type     Rx / DC Orders   ED Discharge Orders          Ordered    guaiFENesin-codeine  100-10 MG/5ML syrup  3 times daily PRN        12/03/23 1300             Note:  This document was prepared using Dragon voice recognition software and may include unintentional dictation errors.   Claria Crofts, MD 12/03/23 [phone]

## 2023-12-03 NOTE — Discharge Instructions (Signed)
 You are seen in the ER today for your cough and shortness of breath.  Your x-[name] fortunately did not show signs of pneumonia.  I sent a prescription for a cough syrup to your pharmacy that you can take to help with your symptoms.  You can also continue to use your albuterol  inhaler as needed.  Return to the ER for new or worsening symptoms.

## 2023-12-14 ENCOUNTER — Telehealth: Payer: Self-pay | Admitting: Family Medicine

## 2023-12-14 NOTE — Telephone Encounter (Signed)
 Called the patient to try and set up new patient appt and she answered and I was in the middle of getting it set up and she hung up on me. Tried to call her back and got no answer. Its ok to schedule her if she calls back she is aware there are no NP appt available until end of June or july

## 2024-06-25 ENCOUNTER — Other Ambulatory Visit (HOSPITAL_BASED_OUTPATIENT_CLINIC_OR_DEPARTMENT_OTHER): Payer: Self-pay | Admitting: Sports Medicine

## 2024-06-25 DIAGNOSIS — M79672 Pain in left foot: Secondary | ICD-10-CM

## 2024-06-25 DIAGNOSIS — S9032XA Contusion of left foot, initial encounter: Secondary | ICD-10-CM

## 2024-06-26 ENCOUNTER — Encounter (HOSPITAL_BASED_OUTPATIENT_CLINIC_OR_DEPARTMENT_OTHER): Payer: Self-pay | Admitting: Nurse Practitioner

## 2024-06-26 ENCOUNTER — Ambulatory Visit (INDEPENDENT_AMBULATORY_CARE_PROVIDER_SITE_OTHER)
Admission: RE | Admit: 2024-06-26 | Discharge: 2024-06-26 | Disposition: A | Discharge status: Home / Self Care | Source: Ambulatory Visit | Attending: Sports Medicine | Admitting: Sports Medicine

## 2024-06-26 DIAGNOSIS — S9032XA Contusion of left foot, initial encounter: Secondary | ICD-10-CM

## 2024-06-26 DIAGNOSIS — M79672 Pain in left foot: Secondary | ICD-10-CM
